# Patient Record
Sex: Male | Born: 1970 | Race: White | Hispanic: No | Marital: Married | Smoking: Never smoker
Health system: Southern US, Community
[De-identification: ages and names within clinical notes are randomized; demographics above are authoritative.]

## PROBLEM LIST (undated history)

## (undated) DIAGNOSIS — K760 Fatty (change of) liver, not elsewhere classified: Secondary | ICD-10-CM

## (undated) DIAGNOSIS — R112 Nausea with vomiting, unspecified: Secondary | ICD-10-CM

## (undated) DIAGNOSIS — Z9889 Other specified postprocedural states: Secondary | ICD-10-CM

## (undated) DIAGNOSIS — F419 Anxiety disorder, unspecified: Secondary | ICD-10-CM

## (undated) DIAGNOSIS — R7989 Other specified abnormal findings of blood chemistry: Secondary | ICD-10-CM

## (undated) DIAGNOSIS — M109 Gout, unspecified: Secondary | ICD-10-CM

## (undated) DIAGNOSIS — E782 Mixed hyperlipidemia: Secondary | ICD-10-CM

## (undated) DIAGNOSIS — N200 Calculus of kidney: Secondary | ICD-10-CM

## (undated) DIAGNOSIS — K5792 Diverticulitis of intestine, part unspecified, without perforation or abscess without bleeding: Secondary | ICD-10-CM

## (undated) DIAGNOSIS — M2012 Hallux valgus (acquired), left foot: Secondary | ICD-10-CM

## (undated) DIAGNOSIS — I10 Essential (primary) hypertension: Secondary | ICD-10-CM

## (undated) DIAGNOSIS — T8859XA Other complications of anesthesia, initial encounter: Secondary | ICD-10-CM

## (undated) DIAGNOSIS — G4730 Sleep apnea, unspecified: Secondary | ICD-10-CM

## (undated) DIAGNOSIS — T4145XA Adverse effect of unspecified anesthetic, initial encounter: Secondary | ICD-10-CM

## (undated) HISTORY — DX: Diverticulitis of intestine, part unspecified, without perforation or abscess without bleeding: K57.92

## (undated) HISTORY — DX: Anxiety disorder, unspecified: F41.9

## (undated) HISTORY — DX: Mixed hyperlipidemia: E78.2

## (undated) HISTORY — DX: Other specified abnormal findings of blood chemistry: R79.89

## (undated) HISTORY — DX: Sleep apnea, unspecified: G47.30

## (undated) HISTORY — PX: VASECTOMY: SHX75

## (undated) HISTORY — DX: Essential (primary) hypertension: I10

## (undated) HISTORY — PX: ANKLE FRACTURE SURGERY: SHX122

## (undated) HISTORY — PX: TONSILLECTOMY AND ADENOIDECTOMY: SUR1326

## (undated) HISTORY — PX: TRIGGER FINGER RELEASE: SHX641

---

## 2006-04-05 ENCOUNTER — Emergency Department: Payer: Self-pay | Admitting: Emergency Medicine

## 2006-11-10 ENCOUNTER — Emergency Department: Payer: Self-pay

## 2009-09-23 ENCOUNTER — Ambulatory Visit: Payer: Self-pay | Admitting: Unknown Physician Specialty

## 2009-09-26 ENCOUNTER — Ambulatory Visit: Payer: Self-pay | Admitting: Unknown Physician Specialty

## 2015-08-04 ENCOUNTER — Encounter: Payer: Self-pay | Admitting: Internal Medicine

## 2015-08-04 ENCOUNTER — Inpatient Hospital Stay: Payer: 59 | Attending: Internal Medicine | Admitting: Internal Medicine

## 2015-08-04 DIAGNOSIS — F419 Anxiety disorder, unspecified: Secondary | ICD-10-CM | POA: Diagnosis not present

## 2015-08-04 DIAGNOSIS — E291 Testicular hypofunction: Secondary | ICD-10-CM

## 2015-08-04 DIAGNOSIS — R7989 Other specified abnormal findings of blood chemistry: Secondary | ICD-10-CM

## 2015-08-04 DIAGNOSIS — G4730 Sleep apnea, unspecified: Secondary | ICD-10-CM

## 2015-08-04 DIAGNOSIS — R5383 Other fatigue: Secondary | ICD-10-CM | POA: Diagnosis not present

## 2015-08-04 DIAGNOSIS — I10 Essential (primary) hypertension: Secondary | ICD-10-CM | POA: Insufficient documentation

## 2015-08-04 DIAGNOSIS — D751 Secondary polycythemia: Secondary | ICD-10-CM | POA: Insufficient documentation

## 2015-08-04 DIAGNOSIS — E785 Hyperlipidemia, unspecified: Secondary | ICD-10-CM | POA: Insufficient documentation

## 2015-08-04 DIAGNOSIS — Z79899 Other long term (current) drug therapy: Secondary | ICD-10-CM

## 2015-08-04 HISTORY — DX: Other specified abnormal findings of blood chemistry: R79.89

## 2015-08-04 LAB — C-REACTIVE PROTEIN: CRP: 0.8 mg/dL (ref <1.0)

## 2015-08-04 LAB — CBC WITH DIFFERENTIAL/PLATELET
Basophils Absolute: 0.1 10*3/uL (ref 0–0.1)
Basophils Relative: 1 %
Eosinophils Absolute: 0.3 10*3/uL (ref 0–0.7)
Eosinophils Relative: 4 %
HEMATOCRIT: 46.1 % (ref 40.0–52.0)
HEMOGLOBIN: 16 g/dL (ref 13.0–18.0)
LYMPHS PCT: 30 %
Lymphs Abs: 2 10*3/uL (ref 1.0–3.6)
MCH: 30.2 pg (ref 26.0–34.0)
MCHC: 34.6 g/dL (ref 32.0–36.0)
MCV: 87.2 fL (ref 80.0–100.0)
MONOS PCT: 6 %
Monocytes Absolute: 0.4 10*3/uL (ref 0.2–1.0)
Neutro Abs: 3.9 10*3/uL (ref 1.4–6.5)
Neutrophils Relative %: 59 %
Platelets: 162 10*3/uL (ref 150–440)
RBC: 5.29 MIL/uL (ref 4.40–5.90)
RDW: 13.3 % (ref 11.5–14.5)
WBC: 6.7 10*3/uL (ref 3.8–10.6)

## 2015-08-04 LAB — COMPREHENSIVE METABOLIC PANEL
ALBUMIN: 4.6 g/dL (ref 3.5–5.0)
ALK PHOS: 76 U/L (ref 38–126)
ALT: 98 U/L — ABNORMAL HIGH (ref 17–63)
ANION GAP: 6 (ref 5–15)
AST: 68 U/L — ABNORMAL HIGH (ref 15–41)
BILIRUBIN TOTAL: 0.6 mg/dL (ref 0.3–1.2)
BUN: 19 mg/dL (ref 6–20)
CALCIUM: 9.6 mg/dL (ref 8.9–10.3)
CO2: 27 mmol/L (ref 22–32)
CREATININE: 1.23 mg/dL (ref 0.61–1.24)
Chloride: 103 mmol/L (ref 101–111)
GFR calc non Af Amer: >60 mL/min (ref >60)
GLUCOSE: 105 mg/dL — AB (ref 65–99)
Potassium: 4.4 mmol/L (ref 3.5–5.1)
Sodium: 136 mmol/L (ref 135–145)
TOTAL PROTEIN: 7.2 g/dL (ref 6.5–8.1)

## 2015-08-04 LAB — TRANSFERRIN: TRANSFERRIN: 302 mg/dL (ref 180–329)

## 2015-08-04 LAB — SEDIMENTATION RATE: Sed Rate: 1 mm/hr (ref 0–15)

## 2015-08-04 NOTE — Progress Notes (Signed)
[place] @ [facility] Telephone:(336) [phone]  Fax:(336) [place]: 1971/04/12  MR#: [id number]  FXJ#:[id number]  No care team member to display  CHIEF COMPLAINT:  Chief Complaint  Patient presents with  . polycythemia     No history exists.    No flowsheet data found.  HISTORY OF PRESENT ILLNESS:   Mr. Baratta is a 44 year old gentleman who is referred to our clinic for evaluation of elevated ferritin. He was found to have minimal erythrocytosis with hemoglobin of 18 g/dL few months ago, while receiving treatment with testosterone gel. Since then treatment with testosterone was discontinued in September 2016, and most recent CBC showed normal hemoglobin concentration. In the same time, ferritin levels were found to be elevated on at least 2 occasions, most recently, less than 10 days ago. Mr. Lona claims that he has obstructive sleep apnea, but he uses CPAP regularly, with good relief of his symptoms, receiving good night sleep. He, however noticed that after discontinuation of testosterone, he has been feeling more fatigued, less energetic, and overall sluggish. He is also worried about his future, while going through the process of divorce.  REVIEW OF SYSTEMS:   Review of Systems  All other systems reviewed and are negative.    PAST MEDICAL HISTORY: Past Medical History  Diagnosis Date  . Hypertension   . Anxiety   . Sleep apnea   . Hyperlipemia, mixed     PAST SURGICAL HISTORY: Past Surgical History  Procedure Laterality Date  . Trigger finger release    . Ankle fracture surgery Left   . Tonsillectomy and adenoidectomy    R index finger phalanx amputation  FAMILY HISTORY Family History  Problem Relation Age of Onset  . Hypertension Father   . Stroke Father   . Stroke Paternal Aunt   Grandfather had liver cirrhosis in his 72s- likely from ETOH  2 daughters are healthy Brother was born with 1 kidney and   ADVANCED DIRECTIVES:  No flowsheet  data found.  HEALTH MAINTENANCE: Social History  Substance Use Topics  . Smoking status: Never Smoker   . Smokeless tobacco: Never Used  . Alcohol Use: Yes     Comment: 3 x week beer and liquor     No Known Allergies  Current Outpatient Prescriptions  Medication Sig Dispense Refill  . ALPRAZolam (XANAX) 0.5 MG tablet Take 0.5 mg by mouth at bedtime as needed for anxiety.    Marland Kitchen amLODipine (NORVASC) 5 MG tablet Take 5 mg by mouth daily.    . colchicine 0.6 MG tablet Take 0.6 mg by mouth daily as needed.    . cyclobenzaprine (FLEXERIL) 5 MG tablet Take 5 mg by mouth 2 (two) times daily as needed for muscle spasms.    . Fenofibrate 150 MG CAPS Take 1 capsule by mouth daily.     No current facility-administered medications for this visit.    OBJECTIVE:  There were no vitals filed for this visit.   There is no height or weight on file to calculate BMI.    ECOG FS:0 - Asymptomatic  Physical Exam  Constitutional: He is oriented to person, place, and time and well-developed, well-nourished, and in no distress. No distress.  Occasion male in no acute distress, ruddy appearance  HENT:  Head: Normocephalic and atraumatic.  Right Ear: External ear normal.  Left Ear: External ear normal.  Nose: Nose normal.  Mouth/Throat: Oropharynx is clear and moist. No oropharyngeal exudate.  Eyes: Conjunctivae and EOM  are normal. Pupils are equal, round, and reactive to light. Right eye exhibits no discharge. Left eye exhibits no discharge. No scleral icterus.  Neck: Normal range of motion. Neck supple. No JVD present. No tracheal deviation present. No thyromegaly present.  Cardiovascular: Normal rate, regular rhythm, normal heart sounds and intact distal pulses.  Exam reveals no gallop and no friction rub.   No murmur heard. Pulmonary/Chest: Effort normal and breath sounds normal. No stridor. No respiratory distress. He has no wheezes. He has no rales. He exhibits no tenderness.  Abdominal: Soft.  Bowel sounds are normal. He exhibits no distension and no mass (Liver tip is palpable). There is no tenderness. There is no rebound and no guarding.  Genitourinary:  Patient deferred  Musculoskeletal: Normal range of motion. He exhibits no edema or tenderness.  Lymphadenopathy:    He has no cervical adenopathy.  Neurological: He is alert and oriented to person, place, and time. He has normal reflexes. No cranial nerve deficit. He exhibits normal muscle tone. Gait normal. Coordination normal. GCS score is 15.  Skin: Skin is warm and dry. No rash noted. He is not diaphoretic. No erythema. No pallor.  Psychiatric: Memory, affect and judgment normal.  Anxious appearing  Nursing note and vitals reviewed.    LAB RESULTS:  CBC Latest Ref Rng 08/04/2015  WBC 3.8 - 10.6 K/uL 6.7  Hemoglobin 13.0 - 18.0 g/dL 16.0  Hematocrit 40.0 - 52.0 % 46.1  Platelets 150 - 440 K/uL 162    Office Visit on 08/04/2015  Component Date Value Ref Range Status  . WBC 08/04/2015 6.7  3.8 - 10.6 K/uL Final  . RBC 08/04/2015 5.29  4.40 - 5.90 MIL/uL Final  . Hemoglobin 08/04/2015 16.0  13.0 - 18.0 g/dL Final  . HCT 08/04/2015 46.1  40.0 - 52.0 % Final  . MCV 08/04/2015 87.2  80.0 - 100.0 fL Final  . MCH 08/04/2015 30.2  26.0 - 34.0 pg Final  . MCHC 08/04/2015 34.6  32.0 - 36.0 g/dL Final  . RDW 08/04/2015 13.3  11.5 - 14.5 % Final  . Platelets 08/04/2015 162  150 - 440 K/uL Final  . Neutrophils Relative % 08/04/2015 59   Final  . Neutro Abs 08/04/2015 3.9  1.4 - 6.5 K/uL Final  . Lymphocytes Relative 08/04/2015 30   Final  . Lymphs Abs 08/04/2015 2.0  1.0 - 3.6 K/uL Final  . Monocytes Relative 08/04/2015 6   Final  . Monocytes Absolute 08/04/2015 0.4  0.2 - 1.0 K/uL Final  . Eosinophils Relative 08/04/2015 4   Final  . Eosinophils Absolute 08/04/2015 0.3  0 - 0.7 K/uL Final  . Basophils Relative 08/04/2015 1   Final  . Basophils Absolute 08/04/2015 0.1  0 - 0.1 K/uL Final  . Sodium 08/04/2015 136  135 -  145 mmol/L Final  . Potassium 08/04/2015 4.4  3.5 - 5.1 mmol/L Final  . Chloride 08/04/2015 103  101 - 111 mmol/L Final  . CO2 08/04/2015 27  22 - 32 mmol/L Final  . Glucose, Bld 08/04/2015 105* 65 - 99 mg/dL Final  . BUN 08/04/2015 19  6 - 20 mg/dL Final  . Creatinine, Ser 08/04/2015 1.23  0.61 - 1.24 mg/dL Final  . Calcium 08/04/2015 9.6  8.9 - 10.3 mg/dL Final  . Total Protein 08/04/2015 7.2  6.5 - 8.1 g/dL Final  . Albumin 08/04/2015 4.6  3.5 - 5.0 g/dL Final  . AST 08/04/2015 68* 15 - 41 U/L Final  . ALT  08/04/2015 98* 17 - 63 U/L Final  . Alkaline Phosphatase 08/04/2015 76  38 - 126 U/L Final  . Total Bilirubin 08/04/2015 0.6  0.3 - 1.2 mg/dL Final  . GFR calc non Af Amer 08/04/2015 >60  >60 mL/min Final  . GFR calc Af Amer 08/04/2015 >60  >60 mL/min Final   Comment: (NOTE) The eGFR has been calculated using the CKD EPI equation. This calculation has not been validated in all clinical situations. eGFR's persistently <60 mL/min signify possible Chronic Kidney Disease.   . Anion gap 08/04/2015 6  5 - 15 Final  . Sed Rate 08/04/2015 1  0 - 15 mm/hr Final    Ferritin from 07/27/2015 measured at 428 ng/mL, above upper limits of normal   STUDIES: No results found.  ASSESSMENT:   MEDICAL DECISION MAKING:  Elevated ferritin-the exact cause is unclear. The patient does not have a family history of liver cirrhosis at the young age, liver cancer, or early heart failure, so whether this is an sign of hemochromatosis or unrelated finding remains to be seen. We will check patient for genetic markers of hereditary hemachromatosis, and if positive, will discuss the need for phlebotomy to prevent iron overload in the future. However, ferritin can be elevated in the multitude of other conditions, such as inflammatory conditions, EtOH intake, hepatitis. We will decide on whether he will need additional evaluation after genetic testing is completed.  Erythrocytosis-most likely secondary to  the use of testosterone gel. Resolved after discontinuation of the use of testosterone. Obstructive sleep apnea may be contributing to erythrocytosis as well. No need for phlebotomy at this point. Continue to use CPAP every night.  Hypogonadism-we discussed the fact that although there is an FDA warning regarding the use of testosterone, and its connection to an increased risk of strokes and MRIs, it applies to the patients for 80 years of age and older. From the description it appears that Mr. Schnick derived significant benefit from the use of testosterone, as such if needed, testosterone should be resumed to improve his functional status. Importantly, no need for phlebotomy if hemoglobin starts to rise again.  Patient expressed understanding and was in agreement with this plan. He also understands that He can call clinic at any time with any questions, concerns, or complaints.  He'll return to our clinic in 2 weeks  No matching staging information was found for the patient.  Roxana Hires, MD   08/04/2015 11:12 AM

## 2015-08-08 LAB — HEMOCHROMATOSIS DNA-PCR(C282Y,H63D)

## 2015-08-18 ENCOUNTER — Ambulatory Visit: Payer: 59

## 2015-08-25 ENCOUNTER — Encounter: Payer: Self-pay | Admitting: Internal Medicine

## 2015-08-25 ENCOUNTER — Inpatient Hospital Stay (HOSPITAL_BASED_OUTPATIENT_CLINIC_OR_DEPARTMENT_OTHER): Payer: 59 | Admitting: Internal Medicine

## 2015-08-25 VITALS — BP 158/79 | HR 81 | Temp 98.0°F | Resp 18 | Ht 72.0 in | Wt 230.8 lb

## 2015-08-25 DIAGNOSIS — I10 Essential (primary) hypertension: Secondary | ICD-10-CM

## 2015-08-25 DIAGNOSIS — Z86711 Personal history of pulmonary embolism: Secondary | ICD-10-CM

## 2015-08-25 DIAGNOSIS — D751 Secondary polycythemia: Secondary | ICD-10-CM | POA: Diagnosis not present

## 2015-08-25 DIAGNOSIS — E785 Hyperlipidemia, unspecified: Secondary | ICD-10-CM

## 2015-08-25 DIAGNOSIS — F419 Anxiety disorder, unspecified: Secondary | ICD-10-CM

## 2015-08-25 DIAGNOSIS — Z9081 Acquired absence of spleen: Secondary | ICD-10-CM

## 2015-08-25 DIAGNOSIS — Z7901 Long term (current) use of anticoagulants: Secondary | ICD-10-CM

## 2015-08-25 DIAGNOSIS — R7989 Other specified abnormal findings of blood chemistry: Secondary | ICD-10-CM

## 2015-08-25 DIAGNOSIS — Z79899 Other long term (current) drug therapy: Secondary | ICD-10-CM | POA: Diagnosis not present

## 2015-08-25 NOTE — Progress Notes (Signed)
[place] @ [facility] Telephone:(336) [phone]  Fax:(336) [place]: 09-27-1970  MR#: [id number]  ZWC#:[id number]  No care team member to display  CHIEF COMPLAINT:  Chief Complaint  Patient presents with  . erythrocytosis     No history exists.    No flowsheet data found.  HISTORY OF PRESENT ILLNESS:   Cory Johnston is a 44 year old gentleman who is referred to our clinic for evaluation of elevated ferritin. He was found to have minimal erythrocytosis with hemoglobin of 18 g/dL few months ago, while receiving treatment with testosterone gel. Since then treatment with testosterone was discontinued in September 2016, and most recent CBC showed normal hemoglobin concentration. In the same time, ferritin levels were found to be elevated on at least 2 occasions, most recently, less than 10 days ago. Cory Johnston claims that he has obstructive sleep apnea, but he uses CPAP regularly, with good relief of his symptoms, receiving good night sleep. He, however noticed that after discontinuation of testosterone, he has been feeling more fatigued, less energetic, and overall sluggish. He is also worried about his future, while going through the process of divorce.  Current status: Cory Johnston returns to our clinic for a follow-up visit. He has not noticed any changes in his overall condition area as he continues to drink alcohol several times a week on a regular basis, and plans on restarting AndroGel.  REVIEW OF SYSTEMS:   Review of Systems  All other systems reviewed and are negative.    PAST MEDICAL HISTORY: Past Medical History  Diagnosis Date  . Hypertension   . Anxiety   . Sleep apnea   . Hyperlipemia, mixed   . Elevated ferritin 08/04/2015    PAST SURGICAL HISTORY: Past Surgical History  Procedure Laterality Date  . Trigger finger release    . Ankle fracture surgery Left   . Tonsillectomy and adenoidectomy    R index finger phalanx amputation  FAMILY  HISTORY Family History  Problem Relation Age of Onset  . Hypertension Father   . Stroke Father   . Stroke Paternal Aunt   Grandfather had liver cirrhosis in his 34s- likely from ETOH  2 daughters are healthy Brother was born with 1 kidney  ADVANCED DIRECTIVES:  No flowsheet data found.  HEALTH MAINTENANCE: Social History  Substance Use Topics  . Smoking status: Never Smoker   . Smokeless tobacco: Never Used  . Alcohol Use: Yes     Comment: 3 x week beer and liquor     No Known Allergies  Current Outpatient Prescriptions  Medication Sig Dispense Refill  . ALPRAZolam (XANAX) 0.5 MG tablet Take 0.5 mg by mouth at bedtime as needed for anxiety.    Marland Kitchen amLODipine (NORVASC) 5 MG tablet Take 5 mg by mouth daily.    . colchicine 0.6 MG tablet Take 0.6 mg by mouth daily as needed.    . cyclobenzaprine (FLEXERIL) 5 MG tablet Take 5 mg by mouth 2 (two) times daily as needed for muscle spasms.    . Fenofibrate 150 MG CAPS Take 1 capsule by mouth daily.     No current facility-administered medications for this visit.    OBJECTIVE:  Filed Vitals:   08/25/15 1017  BP: 158/79  Pulse: 81  Temp: 98 F (36.7 C)  Resp: 18     Body mass index is 31.3 kg/(m^2).    ECOG FS:0 - Asymptomatic  Physical Exam  Constitutional: He is oriented to person, place, and time  and well-developed, well-nourished, and in no distress. No distress.  Occasion male in no acute distress, ruddy appearance  HENT:  Head: Normocephalic and atraumatic.  Right Ear: External ear normal.  Left Ear: External ear normal.  Nose: Nose normal.  Mouth/Throat: Oropharynx is clear and moist. No oropharyngeal exudate.  Eyes: Conjunctivae and EOM are normal. Pupils are equal, round, and reactive to light. Right eye exhibits no discharge. Left eye exhibits no discharge. No scleral icterus.  Neck: Normal range of motion. Neck supple. No JVD present. No tracheal deviation present. No thyromegaly present.  Cardiovascular: Normal  rate, regular rhythm, normal heart sounds and intact distal pulses.  Exam reveals no gallop and no friction rub.   No murmur heard. Pulmonary/Chest: Effort normal and breath sounds normal. No stridor. No respiratory distress. He has no wheezes. He has no rales. He exhibits no tenderness.  Abdominal: Soft. Bowel sounds are normal. He exhibits no distension and no mass (Liver tip is palpable). There is no tenderness. There is no rebound and no guarding.  Genitourinary:  Patient deferred  Musculoskeletal: Normal range of motion. He exhibits no edema or tenderness.  Lymphadenopathy:    He has no cervical adenopathy.  Neurological: He is alert and oriented to person, place, and time. He has normal reflexes. No cranial nerve deficit. He exhibits normal muscle tone. Gait normal. Coordination normal. GCS score is 15.  Skin: Skin is warm and dry. No rash noted. He is not diaphoretic. No erythema. No pallor.  Psychiatric: Memory, affect and judgment normal.  Anxious appearing  Nursing note and vitals reviewed.    LAB RESULTS:  CBC Latest Ref Rng 08/04/2015  WBC 3.8 - 10.6 K/uL 6.7  Hemoglobin 13.0 - 18.0 g/dL 16.0  Hematocrit 40.0 - 52.0 % 46.1  Platelets 150 - 440 K/uL 162    No visits with results within 5 Day(s) from this visit. Latest known visit with results is:  Office Visit on 08/04/2015  Component Date Value Ref Range Status  . DNA Mutation Analysis 08/04/2015 Comment   Final   Comment: (NOTE) NO MUTATION IDENTIFIED Interpretation: This patient's sample was analyzed for the hereditary hemochromatosis (HH) mutations C282Y, H63D, S65C. No mutation was identified. The mutations analyzed by [facility] are most common in the Caucasian population, and up to 90% of affected Caucasians will have a positive test result. Because this panel does not identify rare HH mutations or HH mutations found in other ethnic groups, there are a small number of people who may have a negative test but  may actually be affected. The diagnosis of HH should include clinical findings and other test results such as transferrin-iron saturation and/or serum ferritin studies and/or liver biopsy.  If this patient has a history of HH, in many cases a specific carrier risk can be determined based on this negative result. Methodology: DNA Analysis of the HFE gene was performed by PCR amplification followed by restriction enzyme digestion analyses. Reference: Cathe Mons and Walker AP. (20                          00). Genet Test 4:97-101. Cogswell ME et al. (1999). AM J Prev Med 16:134-140. Bomford A (2002). Lancet 360(9346):1673-81. Stan Head et al. (2002). Blood Cells, Molecules. and Diseases.  29(3):418-432. Imperatore G et al. (2003). Genet Med. 5(1):1-8. Cogswell ME et al. (2003). Genet Med. 5(4):304-10. Genetic counselors are available for health care providers to discuss results at [phone]. Allison Quarry, PhD, [facility]  Ruben Reason, PhD, [facility] Jens Som, PhD, [facility] Annetta Maw, M.S., PhD, [facility] Alfredo Bach, PhD, [facility] Norva Riffle, PhD, [facility] Earlean Polka, PhD, [facility] Performed At: [facility] [address] [place], [place] [id number] Nechama Guard MD BO:[id number]   . WBC 08/04/2015 6.7  3.8 - 10.6 K/uL Final  . RBC 08/04/2015 5.29  4.40 - 5.90 MIL/uL Final  . Hemoglobin 08/04/2015 16.0  13.0 - 18.0 g/dL Final  . HCT 08/04/2015 46.1  40.0 - 52.0 % Final  . MCV 08/04/2015 87.2  80.0 - 100.0 fL Final  . MCH 08/04/2015 30.2  26.0 - 34.0 pg Final  . MCHC 08/04/2015 34.6  32.0 - 36.0 g/dL Final  . RDW 08/04/2015 13.3  11.5 - 14.5 % Final  . Platelets 08/04/2015 162  150 - 440 K/uL Final  . Neutrophils Relative % 08/04/2015 59   Final  . Neutro Abs 08/04/2015 3.9  1.4 - 6.5 K/uL Final  . Lymphocytes Relative 08/04/2015 30   Final  . Lymphs Abs 08/04/2015 2.0  1.0 - 3.6 K/uL Final  . Monocytes Relative 08/04/2015 6   Final  . Monocytes  Absolute 08/04/2015 0.4  0.2 - 1.0 K/uL Final  . Eosinophils Relative 08/04/2015 4   Final  . Eosinophils Absolute 08/04/2015 0.3  0 - 0.7 K/uL Final  . Basophils Relative 08/04/2015 1   Final  . Basophils Absolute 08/04/2015 0.1  0 - 0.1 K/uL Final  . Sodium 08/04/2015 136  135 - 145 mmol/L Final  . Potassium 08/04/2015 4.4  3.5 - 5.1 mmol/L Final  . Chloride 08/04/2015 103  101 - 111 mmol/L Final  . CO2 08/04/2015 27  22 - 32 mmol/L Final  . Glucose, Bld 08/04/2015 105* 65 - 99 mg/dL Final  . BUN 08/04/2015 19  6 - 20 mg/dL Final  . Creatinine, Ser 08/04/2015 1.23  0.61 - 1.24 mg/dL Final  . Calcium 08/04/2015 9.6  8.9 - 10.3 mg/dL Final  . Total Protein 08/04/2015 7.2  6.5 - 8.1 g/dL Final  . Albumin 08/04/2015 4.6  3.5 - 5.0 g/dL Final  . AST 08/04/2015 68* 15 - 41 U/L Final  . ALT 08/04/2015 98* 17 - 63 U/L Final  . Alkaline Phosphatase 08/04/2015 76  38 - 126 U/L Final  . Total Bilirubin 08/04/2015 0.6  0.3 - 1.2 mg/dL Final  . GFR calc non Af Amer 08/04/2015 >60  >60 mL/min Final  . GFR calc Af Amer 08/04/2015 >60  >60 mL/min Final   Comment: (NOTE) The eGFR has been calculated using the CKD EPI equation. This calculation has not been validated in all clinical situations. eGFR's persistently <60 mL/min signify possible Chronic Kidney Disease.   . Anion gap 08/04/2015 6  5 - 15 Final  . CRP 08/04/2015 0.8  <1.0 mg/dL Final   Performed at [facility]  . Sed Rate 08/04/2015 1  0 - 15 mm/hr Final  . Transferrin 08/04/2015 302  180 - 329 mg/dL Final   Performed at [facility]    Ferritin from 07/27/2015 measured at 428 ng/mL, above upper limits of normal   STUDIES: No results found.  ASSESSMENT:   MEDICAL DECISION MAKING:  Elevated ferritin-the exact cause is unclear. He certainly does not have hereditary hemochromatosis, since genetic testing returned negative. As we discussed before, there are other causes of elevated ferritin. He does not have any  evidence of an ongoing inflammation and his sedimentation rate is normal. There  is a chance that the liver damage from alcohol intake and fenofibrate could contribute to elevation and ferritin. We discussed the possibility of stopping fenofibrate and alcohol for 2 weeks prior to next appointment, and rechecking liver function tests and ferritin along with hepatitis profile prior to next appointment. Patient seems to be agreeable to this plan.  Erythrocytosis-most likely secondary to the use of testosterone gel. Resolved after discontinuation of the use of testosterone. Obstructive sleep apnea may be contributing to erythrocytosis as well. No need for phlebotomy at this point. Continue to use CPAP every night. Once again I confirmed with Cory Johnston that he likely will have worsening of erythrocytosis once he restarts AndroGel, and that it is not an indication for phlebotomy.   Patient expressed understanding and was in agreement with this plan. He also understands that He can call clinic at any time with any questions, concerns, or complaints.  He'll return to our clinic in 6 months , and if at that point no significant abnormalities are seen, he will be discharged from the clinic.  No matching staging information was found for the patient.  Roxana Hires, MD   08/25/2015 10:19 AM

## 2016-01-16 ENCOUNTER — Ambulatory Visit: Payer: Self-pay | Admitting: Podiatry

## 2016-01-27 NOTE — Discharge Instructions (Signed)
[facility] °[facility] ° °POST OPERATIVE INSTRUCTIONS FOR DR. TROXLER AND DR. FOWLER °[facility] ° ° °1. Take your medication as prescribed.  Pain medication should be taken only as needed. ° °2. Keep the dressing clean, dry and intact. ° °3. Keep your foot elevated above the heart level for the first 48 hours. ° °4. Walking to the bathroom and brief periods of walking are acceptable, unless we have instructed you to be non-weight bearing. ° °5. Always wear your post-op shoe when walking.  Always use your crutches if you are to be non-weight bearing. ° °6. Do not take a shower. Baths are permissible as long as the foot is kept out of the water.  ° °7. Every hour you are awake:  °- Bend your knee 15 times. °- Flex foot 15 times °- Massage calf 15 times ° °8. Call [facility] ([phone]) if any of the following problems occur: °- You develop a temperature or fever. °- The bandage becomes saturated with blood. °- Medication does not stop your pain. °- Injury of the foot occurs. °- Any symptoms of infection including redness, odor, or red streaks running from wound. ° °General Anesthesia, Adult, Care After °Refer to this sheet in the next few weeks. These instructions provide you with information on caring for yourself after your procedure. Your health care provider may also give you more specific instructions. Your treatment has been planned according to current medical practices, but problems sometimes occur. Call your health care provider if you have any problems or questions after your procedure. °WHAT TO EXPECT AFTER THE PROCEDURE °After the procedure, it is typical to experience: °· Sleepiness. °· Nausea and vomiting. °HOME CARE INSTRUCTIONS °· For the first 24 hours after general anesthesia: °¨ Have a responsible person with you. °¨ Do not drive a car. If you are alone, do not take public transportation. °¨ Do not drink alcohol. °¨ Do not take  medicine that has not been prescribed by your health care provider. °¨ Do not sign important papers or make important decisions. °¨ You may resume a normal diet and activities as directed by your health care provider. °· Change bandages (dressings) as directed. °· If you have questions or problems that seem related to general anesthesia, call the hospital and ask for the anesthetist or anesthesiologist on call. °SEEK MEDICAL CARE IF: °· You have nausea and vomiting that continue the day after anesthesia. °· You develop a rash. °SEEK IMMEDIATE MEDICAL CARE IF:  °· You have difficulty breathing. °· You have chest pain. °· You have any allergic problems. °  °This information is not intended to replace advice given to you by your health care provider. Make sure you discuss any questions you have with your health care provider. °  °Document Released: 11/19/2000 Document Revised: 09/03/2014 Document Reviewed: 12/12/2011 °Elsevier Interactive Patient Education ©2016 [employer] Inc. ° °

## 2016-02-01 ENCOUNTER — Ambulatory Visit
Admission: RE | Admit: 2016-02-01 | Discharge: 2016-02-01 | Disposition: A | Discharge status: Home / Self Care | Payer: 59 | Source: Ambulatory Visit | Attending: Podiatry | Admitting: Podiatry

## 2016-02-01 ENCOUNTER — Encounter
Admission: RE | Disposition: A | Discharge status: Home / Self Care | Payer: Self-pay | Source: Ambulatory Visit | Attending: Podiatry

## 2016-02-01 ENCOUNTER — Encounter: Payer: Self-pay | Admitting: *Deleted

## 2016-02-01 ENCOUNTER — Ambulatory Visit: Payer: 59 | Admitting: Anesthesiology

## 2016-02-01 DIAGNOSIS — M2012 Hallux valgus (acquired), left foot: Secondary | ICD-10-CM | POA: Diagnosis present

## 2016-02-01 DIAGNOSIS — I10 Essential (primary) hypertension: Secondary | ICD-10-CM | POA: Insufficient documentation

## 2016-02-01 DIAGNOSIS — G4730 Sleep apnea, unspecified: Secondary | ICD-10-CM | POA: Diagnosis not present

## 2016-02-01 HISTORY — DX: Other specified postprocedural states: Z98.890

## 2016-02-01 HISTORY — DX: Gout, unspecified: M10.9

## 2016-02-01 HISTORY — DX: Adverse effect of unspecified anesthetic, initial encounter: T41.45XA

## 2016-02-01 HISTORY — PX: AIKEN OSTEOTOMY: SHX6331

## 2016-02-01 HISTORY — DX: Hallux valgus (acquired), left foot: M20.12

## 2016-02-01 HISTORY — DX: Other complications of anesthesia, initial encounter: T88.59XA

## 2016-02-01 HISTORY — DX: Other specified postprocedural states: R11.2

## 2016-02-01 SURGERY — BUNIONECTOMY, AKIN
Anesthesia: General | Laterality: Left | Wound class: Clean

## 2016-02-01 MED ORDER — MIDAZOLAM HCL 2 MG/2ML IJ SOLN: 1.0000 mg | INTRAMUSCULAR | Status: DC | PRN

## 2016-02-01 MED ORDER — OXYCODONE HCL 5 MG/5ML PO SOLN: 5.0000 mg | Freq: Once | ORAL | Status: AC | PRN

## 2016-02-01 MED ORDER — ONDANSETRON HCL 4 MG PO TABS: 4.0000 mg | ORAL_TABLET | Freq: Four times a day (QID) | ORAL | Status: DC | PRN

## 2016-02-01 MED ORDER — ROPIVACAINE HCL 5 MG/ML IJ SOLN
INTRAMUSCULAR | Status: DC | PRN
  Administered 2016-02-01: 20 mL via PERINEURAL

## 2016-02-01 MED ORDER — BUPIVACAINE HCL (PF) 0.25 % IJ SOLN
INTRAMUSCULAR | Status: DC | PRN
  Administered 2016-02-01: 10 mL

## 2016-02-01 MED ORDER — PROMETHAZINE HCL 25 MG/ML IJ SOLN: 6.2500 mg | INTRAMUSCULAR | Status: DC | PRN

## 2016-02-01 MED ORDER — MIDAZOLAM HCL 5 MG/ML IJ SOLN
INTRAMUSCULAR | Status: DC | PRN
  Administered 2016-02-01: 2 mg via INTRAVENOUS

## 2016-02-01 MED ORDER — DEXAMETHASONE SODIUM PHOSPHATE 4 MG/ML IJ SOLN
INTRAMUSCULAR | Status: DC | PRN
  Administered 2016-02-01: 4 mg via INTRAVENOUS

## 2016-02-01 MED ORDER — CEFAZOLIN SODIUM-DEXTROSE 2-4 GM/100ML-% IV SOLN
2.0000 g | Freq: Once | INTRAVENOUS | Status: AC
  Administered 2016-02-01: 2 g via INTRAVENOUS

## 2016-02-01 MED ORDER — PROPOFOL 10 MG/ML IV BOLUS
INTRAVENOUS | Status: DC | PRN
  Administered 2016-02-01: 200 mg via INTRAVENOUS

## 2016-02-01 MED ORDER — LACTATED RINGERS IV SOLN
INTRAVENOUS | Status: DC
  Administered 2016-02-01: 11:00:00 via INTRAVENOUS

## 2016-02-01 MED ORDER — FENTANYL CITRATE (PF) 100 MCG/2ML IJ SOLN
INTRAMUSCULAR | Status: DC | PRN
  Administered 2016-02-01: 100 ug via INTRAVENOUS

## 2016-02-01 MED ORDER — LACTATED RINGERS IV SOLN: INTRAVENOUS | Status: DC

## 2016-02-01 MED ORDER — MEPERIDINE HCL 25 MG/ML IJ SOLN: 6.2500 mg | INTRAMUSCULAR | Status: DC | PRN

## 2016-02-01 MED ORDER — OXYCODONE-ACETAMINOPHEN 5-325 MG PO TABS: 1.0000 | ORAL_TABLET | ORAL | Status: DC | PRN

## 2016-02-01 MED ORDER — ONDANSETRON HCL 4 MG/2ML IJ SOLN
INTRAMUSCULAR | Status: DC | PRN
  Administered 2016-02-01: 4 mg via INTRAVENOUS

## 2016-02-01 MED ORDER — FENTANYL CITRATE (PF) 100 MCG/2ML IJ SOLN: 50.0000 ug | INTRAMUSCULAR | Status: DC | PRN

## 2016-02-01 MED ORDER — ONDANSETRON HCL 4 MG/2ML IJ SOLN: 4.0000 mg | Freq: Four times a day (QID) | INTRAMUSCULAR | Status: DC | PRN

## 2016-02-01 MED ORDER — SCOPOLAMINE 1 MG/3DAYS TD PT72
1.0000 | MEDICATED_PATCH | Freq: Once | TRANSDERMAL | Status: DC
  Administered 2016-02-01: 1.5 mg via TRANSDERMAL

## 2016-02-01 MED ORDER — OXYCODONE-ACETAMINOPHEN 7.5-325 MG PO TABS: ORAL_TABLET | ORAL | Status: DC

## 2016-02-01 MED ORDER — LIDOCAINE HCL (CARDIAC) 20 MG/ML IV SOLN
INTRAVENOUS | Status: DC | PRN
  Administered 2016-02-01: 30 mg via INTRATRACHEAL

## 2016-02-01 MED ORDER — OXYCODONE HCL 5 MG PO TABS
5.0000 mg | ORAL_TABLET | Freq: Once | ORAL | Status: AC | PRN
  Administered 2016-02-01: 5 mg via ORAL

## 2016-02-01 MED ORDER — FENTANYL CITRATE (PF) 100 MCG/2ML IJ SOLN: 25.0000 ug | INTRAMUSCULAR | Status: DC | PRN

## 2016-02-01 SURGICAL SUPPLY — 44 items
BANDAGE ELASTIC 4 VELCRO NS (GAUZE/BANDAGES/DRESSINGS) ×2 IMPLANT
BENZOIN TINCTURE PRP APPL 2/3 (GAUZE/BANDAGES/DRESSINGS) ×2 IMPLANT
BLADE MED AGGRESSIVE (BLADE) ×2 IMPLANT
BLADE OSC/SAGITTAL MD 5.5X18 (BLADE) IMPLANT
BLADE OSC/SAGITTAL MD 9X18.5 (BLADE) ×2 IMPLANT
BLADE SURG 15 STRL LF DISP TIS (BLADE) IMPLANT
BLADE SURG 15 STRL SS (BLADE)
BNDG ESMARK 4X12 TAN STRL LF (GAUZE/BANDAGES/DRESSINGS) ×2 IMPLANT
BNDG GAUZE 4.5X4.1 6PLY STRL (MISCELLANEOUS) ×2 IMPLANT
BNDG STRETCH 4X75 STRL LF (GAUZE/BANDAGES/DRESSINGS) ×2 IMPLANT
CANISTER SUCT 1200ML W/VALVE (MISCELLANEOUS) ×2 IMPLANT
COVER LIGHT HANDLE UNIVERSAL (MISCELLANEOUS) ×4 IMPLANT
CUFF TOURN SGL QUICK 18 (TOURNIQUET CUFF) ×2 IMPLANT
DRAPE FLUOR MINI C-ARM 54X84 (DRAPES) ×2 IMPLANT
DURAPREP 26ML APPLICATOR (WOUND CARE) ×2 IMPLANT
GAUZE PETRO XEROFOAM 1X8 (MISCELLANEOUS) ×2 IMPLANT
GAUZE SPONGE 4X4 12PLY STRL (GAUZE/BANDAGES/DRESSINGS) ×2 IMPLANT
GLOVE BIO SURGEON STRL SZ7.5 (GLOVE) ×2 IMPLANT
GLOVE INDICATOR 8.0 STRL GRN (GLOVE) ×2 IMPLANT
GOWN STRL REUS W/ TWL LRG LVL3 (GOWN DISPOSABLE) ×2 IMPLANT
GOWN STRL REUS W/TWL LRG LVL3 (GOWN DISPOSABLE) ×2
K-WIRE DBL END TROCAR 6X.045 (WIRE) ×2
K-WIRE DBL END TROCAR 6X.062 (WIRE) ×2
K-WIRE DBL TROCAR .062X4 ×2 IMPLANT
KIT ROOM TURNOVER OR (KITS) ×2 IMPLANT
KWIRE DBL END TROCAR 6X.045 (WIRE) ×1 IMPLANT
KWIRE DBL END TROCAR 6X.062 (WIRE) ×1 IMPLANT
KWIRE DBL TROCAR .062X4 ×1 IMPLANT
Metric super staple (Staple) ×2 IMPLANT
NS IRRIG 500ML POUR BTL (IV SOLUTION) ×2 IMPLANT
PACK EXTREMITY ARMC (MISCELLANEOUS) ×2 IMPLANT
PAD GROUND ADULT SPLIT (MISCELLANEOUS) ×2 IMPLANT
PIN BALLS 3/8 F/.045 WIRE (MISCELLANEOUS) IMPLANT
RASP SM TEAR CROSS CUT (RASP) ×2 IMPLANT
STOCKINETTE STRL 6IN 960660 (GAUZE/BANDAGES/DRESSINGS) ×2 IMPLANT
STRAP BODY AND KNEE 60X3 (MISCELLANEOUS) ×2 IMPLANT
STRIP CLOSURE SKIN 1/4X4 (GAUZE/BANDAGES/DRESSINGS) ×2 IMPLANT
SUT MNCRL 5-0+ PC-1 (SUTURE) ×1 IMPLANT
SUT MONOCRYL 5-0 (SUTURE) ×1
SUT VIC AB 2-0 SH 27 (SUTURE)
SUT VIC AB 2-0 SH 27XBRD (SUTURE) IMPLANT
SUT VIC AB 3-0 SH 27 (SUTURE)
SUT VIC AB 3-0 SH 27X BRD (SUTURE) IMPLANT
SUT VIC AB 4-0 FS2 27 (SUTURE) ×2 IMPLANT

## 2016-02-01 NOTE — Progress Notes (Signed)
Assisted Scott Mculloch ANMD with left, ultrasound guided, popliteal block. Side rails up, monitors on throughout procedure. See vital signs in flow sheet. Tolerated Procedure well.   

## 2016-02-01 NOTE — Op Note (Signed)
Operative note   Surgeon:Doralee Kocak    Assistant:none    Preop diagnosis:Left foot hallux valgus    Postop diagnosis:same    Procedure:Double osteotomy (austin/akin) hallux valgus correction    EBL: Minimal     Anesthesia:regional and general    Hemostasis: Ankle tourniquet inflated to 250 mmHg for approximate 70 minutes    Specimen: Bone and soft tissue with crystals    Complications: None    Operative indications:Foye R Koker is an 45 y.o. that presents today for surgical intervention.  The risks/benefits/alternatives/complications have been discussed and consent has been given.    Procedure:  Patient was brought into the OR and placed on the operating table in thesupine position. After anesthesia was obtained theleft lower extremity was prepped and draped in usual sterile fashion.  Attention was directed to the dorsomedial aspect of the left first MTPJ where a dorsal medial incision was performed. Sharp and blunt dissection carried down to the capsule. A T capsulotomy was then performed. The prominent dorsal medial eminence was noted and transected with a power saw. At this time a V osteotomy was created and the capital fragment was translocated laterally. Good realignment of the metatarsal was noted but there was still valgus noted of the great toe at the metatarsophalangeal joint. At this time I elected to perform a webspace dissection. Sharp and blunt dissection was carried down to the deep transverse intermetatarsal ligament. This was transected. Next the conjoined tendon of the abductor was removed from the lateral capsule. Finally the sesamoid apparatus was freed. The first MTPJ was placed range of motion and much more flexible. I was able to realign the metatarsophalangeal joint. The capital fragment was translocated back into the lateral position and stabilized with a 0.062 K wire. The ensuing overhanging ledge was transected with a power saw. The remainder of the prominence  was smoothed with a power rasp. The wound was flushed with copious amounts or irrigation.  There was still noted be valgus of the great toe distal to the metatarsophalangeal joint and a an Akin osteotomy was then performed. Attention was directed to the dorsal aspect of the proximal phalanx where a guidewire was placed to the lateral aspect of the great toe from a dorsal to plantar position just distal to the base of the proximal phalanx. This was used as the apical axis guide for the cut. A small wedge the apex lateral was performed. The wedge was then closed and the lateral hinge was left intact. A 11 x 10 mm compression staple was applied to the dorsal medial proximal phalanx. Good compression and stability was noted. Good alignment was noted in all planes. The wounds were then flushed with copious amounts or irrigation. Layered closure was performed with a 3-0 Vicryl for the deeper layer. A small capsulorrhaphy was performed. 4-0 Vicryl for the subcutaneous tissue and 5-0 Monocryl undyed for the skin. A well compressive sterile dressing was then applied.    Patient tolerated the procedure and anesthesia well.  Was transported from the OR to the PACU with all vital signs stable and vascular status intact. To be discharged per routine protocol.  Will follow up in approximately 1 week in the outpatient clinic.

## 2016-02-01 NOTE — Transfer of Care (Signed)
Immediate Anesthesia Transfer of Care Note  Patient: Cory Johnston  Procedure(s) Performed: Procedure(s) with comments: Barbie Banner OSTEOTOMY AUSTIN LEFT 1ST METATARSAL (Left) - WITH POPLITEAL CPAP  Patient Location: PACU  Anesthesia Type: General  Level of Consciousness: awake, alert  and patient cooperative  Airway and Oxygen Therapy: Patient Spontanous Breathing and Patient connected to supplemental oxygen  Post-op Assessment: Post-op Vital signs reviewed, Patient's Cardiovascular Status Stable, Respiratory Function Stable, Patent Airway and No signs of Nausea or vomiting  Post-op Vital Signs: Reviewed and stable  Complications: No apparent anesthesia complications

## 2016-02-01 NOTE — Anesthesia Postprocedure Evaluation (Signed)
Anesthesia Post Note  Patient: Cory Johnston  Procedure(s) Performed: Procedure(s) (LRB): Barbie Banner OSTEOTOMY AUSTIN LEFT 1ST METATARSAL (Left)  Patient location during evaluation: PACU Anesthesia Type: General Level of consciousness: awake and alert Pain management: pain level controlled Vital Signs Assessment: post-procedure vital signs reviewed and stable Respiratory status: spontaneous breathing, nonlabored ventilation, respiratory function stable and patient connected to nasal cannula oxygen Cardiovascular status: blood pressure returned to baseline and stable Postop Assessment: no signs of nausea or vomiting Anesthetic complications: no    Marshell Levan

## 2016-02-01 NOTE — Anesthesia Procedure Notes (Addendum)
Anesthesia Regional Block:  Popliteal block  Pre-Anesthetic Checklist: ,, timeout performed, Correct Patient, Correct Site, Correct Laterality, Correct Procedure, Correct Position, site marked, Risks and benefits discussed,  Surgical consent,  Pre-op evaluation,  At surgeon's request and post-op pain management  Laterality: Left  Prep: chloraprep       Needles:  Injection technique: Single-shot  Needle Type: Echogenic Needle     Needle Length: 9cm 9 cm Needle Gauge: 21 and 21 G    Additional Needles:  Procedures: ultrasound guided (picture in chart) Popliteal block Narrative:  Start time: 02/01/2016 11:28 AM End time: 02/01/2016 11:34 AM Injection made incrementally with aspirations every 5 mL.  Performed by: Personally  Anesthesiologist: MCCULLOCH, SCOTT  Additional Notes: Functioning IV was confirmed and monitors applied. Ultrasound guidance: relevant anatomy identified, needle position confirmed, local anesthetic spread visualized around nerve(s)., vascular puncture avoided.  Image printed for medical record.  Negative aspiration and no paresthesias; incremental administration of local anesthetic. The patient tolerated the procedure well. Vitals signes recorded in RN notes.   Procedure Name: LMA Insertion Date/Time: 02/01/2016 12:19 PM Performed by: Londell Moh Pre-anesthesia Checklist: Patient identified, Emergency Drugs available, Suction available, Timeout performed and Patient being monitored Patient Re-evaluated:Patient Re-evaluated prior to inductionOxygen Delivery Method: Circle system utilized Preoxygenation: Pre-oxygenation with 100% oxygen Intubation Type: IV induction LMA: LMA inserted LMA Size: 4.0 Number of attempts: 1 Placement Confirmation: positive ETCO2 and breath sounds checked- equal and bilateral Tube secured with: Tape

## 2016-02-01 NOTE — Anesthesia Preprocedure Evaluation (Addendum)
Anesthesia Evaluation  Patient identified by MRN, date of birth, ID band Patient awake    History of Anesthesia Complications (+) PONV  Airway Mallampati: III  TM Distance: >3 FB Neck ROM: Full    Dental   Pulmonary sleep apnea and Continuous Positive Airway Pressure Ventilation ,    breath sounds clear to auscultation       Cardiovascular hypertension, Pt. on medications Normal cardiovascular exam     Neuro/Psych    GI/Hepatic   Endo/Other    Renal/GU      Musculoskeletal   Abdominal   Peds  Hematology   Anesthesia Other Findings   Reproductive/Obstetrics                            Anesthesia Physical Anesthesia Plan  ASA: II  Anesthesia Plan: General   Post-op Pain Management: GA combined w/ Regional for post-op pain   Induction: Intravenous  Airway Management Planned: LMA  Additional Equipment:   Intra-op Plan:   Post-operative Plan:   Informed Consent: I have reviewed the patients History and Physical, chart, labs and discussed the procedure including the risks, benefits and alternatives for the proposed anesthesia with the patient or authorized representative who has indicated his/her understanding and acceptance.     Plan Discussed with: CRNA  Anesthesia Plan Comments:         Anesthesia Quick Evaluation

## 2016-02-01 NOTE — H&P (Signed)
  HISTORY AND PHYSICAL INTERVAL NOTE:  02/01/2016  11:59 AM  Cory Johnston  has presented today for surgery, with the diagnosis of M20.12 ACQUIRED HALLUX VALGUS OF LEFT FOOT.  The various methods of treatment have been discussed with the patient.  No guarantees were given.  After consideration of risks, benefits and other options for treatment, the patient has consented to surgery.  I have reviewed the patients' chart and labs.    Patient Vitals for the past 24 hrs:  BP Temp Pulse Resp SpO2 Height Weight  02/01/16 1135 (!) 137/93 mmHg - (!) 59 (!) 9 93 % - -  02/01/16 1130 (!) 150/99 mmHg - 68 11 95 % - -  02/01/16 1058 (!) 154/98 mmHg 98.6 F (37 C) 75 16 97 % 6' (1.829 m) 99.791 kg (220 lb)    A history and physical examination was performed in my office.  The patient was reexamined.  There have been no changes to this history and physical examination.  Samara Deist A

## 2016-02-02 ENCOUNTER — Encounter: Payer: Self-pay | Admitting: Podiatry

## 2016-02-03 LAB — SURGICAL PATHOLOGY

## 2016-02-16 ENCOUNTER — Other Ambulatory Visit: Payer: 59

## 2016-02-23 ENCOUNTER — Ambulatory Visit: Payer: 59 | Admitting: Family Medicine

## 2016-05-14 DIAGNOSIS — Z872 Personal history of diseases of the skin and subcutaneous tissue: Secondary | ICD-10-CM

## 2016-05-14 HISTORY — DX: Personal history of diseases of the skin and subcutaneous tissue: Z87.2

## 2016-08-29 DIAGNOSIS — G4733 Obstructive sleep apnea (adult) (pediatric): Secondary | ICD-10-CM | POA: Insufficient documentation

## 2016-10-10 ENCOUNTER — Other Ambulatory Visit: Payer: Self-pay | Admitting: Internal Medicine

## 2016-10-10 DIAGNOSIS — R748 Abnormal levels of other serum enzymes: Secondary | ICD-10-CM

## 2016-10-17 ENCOUNTER — Ambulatory Visit
Admission: RE | Admit: 2016-10-17 | Discharge: 2016-10-17 | Disposition: A | Discharge status: Home / Self Care | Payer: 59 | Source: Ambulatory Visit | Attending: Internal Medicine | Admitting: Internal Medicine

## 2016-10-17 DIAGNOSIS — K760 Fatty (change of) liver, not elsewhere classified: Secondary | ICD-10-CM | POA: Diagnosis not present

## 2016-10-17 DIAGNOSIS — R748 Abnormal levels of other serum enzymes: Secondary | ICD-10-CM | POA: Diagnosis present

## 2017-02-18 DIAGNOSIS — F418 Other specified anxiety disorders: Secondary | ICD-10-CM | POA: Insufficient documentation

## 2017-09-27 ENCOUNTER — Other Ambulatory Visit
Admission: RE | Admit: 2017-09-27 | Discharge: 2017-09-27 | Disposition: A | Discharge status: Home / Self Care | Payer: PRIVATE HEALTH INSURANCE | Source: Ambulatory Visit | Attending: Physician Assistant | Admitting: Physician Assistant

## 2017-09-27 DIAGNOSIS — R600 Localized edema: Secondary | ICD-10-CM | POA: Insufficient documentation

## 2017-09-27 LAB — FIBRIN DERIVATIVES D-DIMER (ARMC ONLY): Fibrin derivatives D-dimer (ARMC): 117.4 ng/mL (FEU) (ref 0.00–499.00)

## 2017-10-15 ENCOUNTER — Encounter: Payer: Self-pay | Admitting: Emergency Medicine

## 2017-10-15 ENCOUNTER — Inpatient Hospital Stay
Admission: EM | Admit: 2017-10-15 | Discharge: 2017-10-17 | DRG: 392 | Disposition: A | Discharge status: Home / Self Care | Payer: PRIVATE HEALTH INSURANCE | Attending: Surgery | Admitting: Surgery

## 2017-10-15 ENCOUNTER — Emergency Department: Payer: PRIVATE HEALTH INSURANCE

## 2017-10-15 DIAGNOSIS — Z79899 Other long term (current) drug therapy: Secondary | ICD-10-CM

## 2017-10-15 DIAGNOSIS — I10 Essential (primary) hypertension: Secondary | ICD-10-CM | POA: Diagnosis present

## 2017-10-15 DIAGNOSIS — G4730 Sleep apnea, unspecified: Secondary | ICD-10-CM | POA: Diagnosis present

## 2017-10-15 DIAGNOSIS — K5792 Diverticulitis of intestine, part unspecified, without perforation or abscess without bleeding: Principal | ICD-10-CM | POA: Diagnosis present

## 2017-10-15 DIAGNOSIS — Z8249 Family history of ischemic heart disease and other diseases of the circulatory system: Secondary | ICD-10-CM | POA: Diagnosis not present

## 2017-10-15 DIAGNOSIS — E782 Mixed hyperlipidemia: Secondary | ICD-10-CM | POA: Diagnosis present

## 2017-10-15 HISTORY — DX: Diverticulitis of intestine, part unspecified, without perforation or abscess without bleeding: K57.92

## 2017-10-15 LAB — COMPREHENSIVE METABOLIC PANEL
ALT: 51 U/L (ref 17–63)
AST: 43 U/L — AB (ref 15–41)
Albumin: 4.2 g/dL (ref 3.5–5.0)
Alkaline Phosphatase: 38 U/L (ref 38–126)
Anion gap: 11 (ref 5–15)
BUN: 15 mg/dL (ref 6–20)
CHLORIDE: 107 mmol/L (ref 101–111)
CO2: 22 mmol/L (ref 22–32)
Calcium: 9.5 mg/dL (ref 8.9–10.3)
Creatinine, Ser: 1.2 mg/dL (ref 0.61–1.24)
GFR calc Af Amer: >60 mL/min (ref >60)
GLUCOSE: 131 mg/dL — AB (ref 65–99)
POTASSIUM: 3.6 mmol/L (ref 3.5–5.1)
SODIUM: 140 mmol/L (ref 135–145)
Total Bilirubin: 0.8 mg/dL (ref 0.3–1.2)
Total Protein: 7.3 g/dL (ref 6.5–8.1)

## 2017-10-15 LAB — INFLUENZA PANEL BY PCR (TYPE A & B)
INFLAPCR: NEGATIVE
INFLBPCR: NEGATIVE

## 2017-10-15 LAB — URINALYSIS, COMPLETE (UACMP) WITH MICROSCOPIC
BACTERIA UA: NONE SEEN
BILIRUBIN URINE: NEGATIVE
Glucose, UA: NEGATIVE mg/dL
Hgb urine dipstick: NEGATIVE
Ketones, ur: NEGATIVE mg/dL
LEUKOCYTES UA: NEGATIVE
Nitrite: NEGATIVE
PROTEIN: NEGATIVE mg/dL
RBC / HPF: NONE SEEN RBC/hpf (ref 0–5)
SPECIFIC GRAVITY, URINE: 1.012 (ref 1.005–1.030)
SQUAMOUS EPITHELIAL / LPF: NONE SEEN
WBC, UA: NONE SEEN WBC/hpf (ref 0–5)
pH: 7 (ref 5.0–8.0)

## 2017-10-15 LAB — LIPASE, BLOOD: LIPASE: 28 U/L (ref 11–51)

## 2017-10-15 LAB — CBC
HEMATOCRIT: 43.3 % (ref 40.0–52.0)
Hemoglobin: 15.1 g/dL (ref 13.0–18.0)
MCH: 30.2 pg (ref 26.0–34.0)
MCHC: 34.9 g/dL (ref 32.0–36.0)
MCV: 86.4 fL (ref 80.0–100.0)
Platelets: 182 10*3/uL (ref 150–440)
RBC: 5.01 MIL/uL (ref 4.40–5.90)
RDW: 13.4 % (ref 11.5–14.5)
WBC: 10.9 10*3/uL — AB (ref 3.8–10.6)

## 2017-10-15 MED ORDER — SODIUM CHLORIDE 0.9 % IV BOLUS (SEPSIS)
1000.0000 mL | Freq: Once | INTRAVENOUS | Status: AC
  Administered 2017-10-15: 1000 mL via INTRAVENOUS

## 2017-10-15 MED ORDER — ONDANSETRON HCL 4 MG/2ML IJ SOLN: 4.0000 mg | Freq: Four times a day (QID) | INTRAMUSCULAR | Status: DC | PRN

## 2017-10-15 MED ORDER — MORPHINE SULFATE (PF) 4 MG/ML IV SOLN
4.0000 mg | Freq: Once | INTRAVENOUS | Status: AC
  Administered 2017-10-15: 4 mg via INTRAVENOUS
  Filled 2017-10-15: qty 1

## 2017-10-15 MED ORDER — IOPAMIDOL (ISOVUE-300) INJECTION 61%
100.0000 mL | Freq: Once | INTRAVENOUS | Status: AC | PRN
  Administered 2017-10-15: 100 mL via INTRAVENOUS

## 2017-10-15 MED ORDER — ALPRAZOLAM 0.5 MG PO TABS: 0.5000 mg | ORAL_TABLET | Freq: Every evening | ORAL | Status: DC | PRN

## 2017-10-15 MED ORDER — PIPERACILLIN-TAZOBACTAM 3.375 G IVPB 30 MIN: 3.3750 g | Freq: Once | INTRAVENOUS | Status: DC

## 2017-10-15 MED ORDER — HYDROMORPHONE HCL 1 MG/ML IJ SOLN
0.5000 mg | INTRAMUSCULAR | Status: DC | PRN
  Administered 2017-10-15 – 2017-10-16 (×5): 0.5 mg via INTRAVENOUS
  Filled 2017-10-15 (×4): qty 1

## 2017-10-15 MED ORDER — HYDROMORPHONE HCL 1 MG/ML IJ SOLN
INTRAMUSCULAR | Status: AC
Start: 2017-10-15 — End: 2017-10-16
  Filled 2017-10-15: qty 1

## 2017-10-15 MED ORDER — PIPERACILLIN-TAZOBACTAM 3.375 G IVPB
3.3750 g | Freq: Three times a day (TID) | INTRAVENOUS | Status: DC
  Administered 2017-10-15 – 2017-10-17 (×6): 3.375 g via INTRAVENOUS
  Filled 2017-10-15 (×5): qty 50

## 2017-10-15 MED ORDER — CYCLOBENZAPRINE HCL 10 MG PO TABS: 5.0000 mg | ORAL_TABLET | Freq: Two times a day (BID) | ORAL | Status: DC | PRN

## 2017-10-15 MED ORDER — PIPERACILLIN-TAZOBACTAM 3.375 G IVPB
INTRAVENOUS | Status: AC
  Administered 2017-10-15: 3.375 g via INTRAVENOUS
  Filled 2017-10-15: qty 50

## 2017-10-15 MED ORDER — ATORVASTATIN CALCIUM 20 MG PO TABS
10.0000 mg | ORAL_TABLET | Freq: Every day | ORAL | Status: DC
  Administered 2017-10-16 – 2017-10-17 (×2): 10 mg via ORAL
  Filled 2017-10-15 (×2): qty 1

## 2017-10-15 MED ORDER — HEPARIN SODIUM (PORCINE) 5000 UNIT/ML IJ SOLN
5000.0000 [IU] | Freq: Three times a day (TID) | INTRAMUSCULAR | Status: DC
  Administered 2017-10-15 – 2017-10-17 (×5): 5000 [IU] via SUBCUTANEOUS
  Filled 2017-10-15 (×5): qty 1

## 2017-10-15 MED ORDER — IOPAMIDOL (ISOVUE-300) INJECTION 61%
30.0000 mL | Freq: Once | INTRAVENOUS | Status: AC | PRN
  Administered 2017-10-15: 30 mL via ORAL

## 2017-10-15 MED ORDER — KETOROLAC TROMETHAMINE 30 MG/ML IJ SOLN
30.0000 mg | Freq: Once | INTRAMUSCULAR | Status: AC
  Administered 2017-10-15: 30 mg via INTRAVENOUS
  Filled 2017-10-15: qty 1

## 2017-10-15 MED ORDER — AMLODIPINE BESYLATE 5 MG PO TABS
5.0000 mg | ORAL_TABLET | Freq: Every day | ORAL | Status: DC
  Administered 2017-10-15 – 2017-10-17 (×3): 5 mg via ORAL
  Filled 2017-10-15 (×3): qty 1

## 2017-10-15 MED ORDER — LACTATED RINGERS IV SOLN
INTRAVENOUS | Status: DC
  Administered 2017-10-15 – 2017-10-17 (×6): via INTRAVENOUS

## 2017-10-15 MED ORDER — ONDANSETRON HCL 4 MG PO TABS: 4.0000 mg | ORAL_TABLET | Freq: Four times a day (QID) | ORAL | Status: DC | PRN

## 2017-10-15 MED ORDER — ONDANSETRON HCL 4 MG/2ML IJ SOLN
4.0000 mg | Freq: Once | INTRAMUSCULAR | Status: AC
  Administered 2017-10-15: 4 mg via INTRAVENOUS
  Filled 2017-10-15: qty 2

## 2017-10-15 NOTE — ED Notes (Signed)
Patient transported to CT 

## 2017-10-15 NOTE — ED Notes (Signed)
Password set up per patient: Cory Johnston

## 2017-10-15 NOTE — Plan of Care (Signed)
  Progressing Education: Knowledge of General Education information will improve 10/15/2017 2230 - Progressing by Kaylie Ritter, Floyce Stakes, RN

## 2017-10-15 NOTE — Progress Notes (Signed)
ANTIBIOTIC CONSULT NOTE - INITIAL  Pharmacy Consult for Zosyn Indication: IAI  No Known Allergies  Patient Measurements: Height: 6' 0.01" (182.9 cm) Weight: 220 lb (99.8 kg) IBW/kg (Calculated) : 77.62 Adjusted Body Weight:   Vital Signs: Temp: 99.4 F (37.4 C) (02/19 1403) Temp Source: Oral (02/19 1403) BP: 167/83 (02/19 1403) Pulse Rate: 97 (02/19 1403) Intake/Output from previous day: No intake/output data recorded. Intake/Output from this shift: No intake/output data recorded.  Labs: Recent Labs    10/15/17 1401  WBC 10.9*  HGB 15.1  PLT 182  CREATININE 1.20   Estimated Creatinine Clearance: 92.1 mL/min (by C-G formula based on SCr of 1.2 mg/dL). No results for input(s): VANCOTROUGH, VANCOPEAK, VANCORANDOM, GENTTROUGH, GENTPEAK, GENTRANDOM, TOBRATROUGH, TOBRAPEAK, TOBRARND, AMIKACINPEAK, AMIKACINTROU, AMIKACIN in the last 72 hours.   Microbiology: No results found for this or any previous visit (from the past 720 hour(s)).  Medical History: Past Medical History:  Diagnosis Date  . Anxiety   . Complication of anesthesia   . Elevated ferritin 08/04/2015  . Gout    hx of  . Hallux valgus of left foot   . Hyperlipemia, mixed   . Hypertension    controlled on meds  . PONV (postoperative nausea and vomiting)   . Sleep apnea    cpap    Medications:  Infusions:  . piperacillin-tazobactam (ZOSYN)  IV    . piperacillin-tazobactam     Assessment: 46 yom cc abdominal pain with PMH HTN, anxiety, Gout, hallux valgus left foot, HLD, PONV, OSA on CPAP. Pharmacy consulted to dose Zosyn for IAI  Goal of Therapy:  Resolve infection Prevent ADE  Plan:  Zosyn 3.375 gm IV Q8H EI  Laural Benes, Pharm.D., BCPS Clinical Pharmacist 10/15/2017,5:08 PM

## 2017-10-15 NOTE — H&P (Signed)
Cory Johnston is an 47 y.o. male.    Chief Complaint: Lower abdominal pain  HPI: This patient with a gradual onset of lower abdominal pain he initially stated that it started yesterday but now states that he has not been feeling well for the last 3-4 days.  He has had some diarrhea but no melena or hematochezia no fevers or chills mild nausea but is been able to eat no vomiting.  He is never had an episode like this before.  He has no family history of colon cancer Crohn's disease or ulcerative colitis no diverticulitis.  He works for the [employer] [place] [employer] does not smoke but drinks alcohol just not every day.  Past Medical History:  Diagnosis Date  . Anxiety   . Complication of anesthesia   . Elevated ferritin 08/04/2015  . Gout    hx of  . Hallux valgus of left foot   . Hyperlipemia, mixed   . Hypertension    controlled on meds  . PONV (postoperative nausea and vomiting)   . Sleep apnea    cpap    Past Surgical History:  Procedure Laterality Date  . Barbie Banner OSTEOTOMY Left 02/01/2016   Procedure: Barbie Banner OSTEOTOMY AUSTIN LEFT 1ST METATARSAL;  Surgeon: Samara Deist, DPM;  Location: [facility];  Service: Podiatry;  Laterality: Left;  WITH POPLITEAL CPAP  . ANKLE FRACTURE SURGERY Left   . TONSILLECTOMY AND ADENOIDECTOMY    . TRIGGER FINGER RELEASE    . VASECTOMY      Family History  Problem Relation Age of Onset  . Hypertension Father   . Stroke Father   . CAD Father   . Hyperlipidemia Father   . Diabetes Father   . Stroke Paternal Aunt    Social History:  reports that  has never smoked. he has never used smokeless tobacco. He reports that he drinks about 8.4 oz of alcohol per week. He reports that he does not use drugs.  Allergies: No Known Allergies   (Not in a hospital admission)   Review of Systems  Constitutional: Negative.   HENT: Negative.   Eyes: Negative.   Respiratory: Negative.   Cardiovascular: Negative.   Gastrointestinal: Positive for abdominal  pain, diarrhea and nausea. Negative for blood in stool, constipation, heartburn, melena and vomiting.  Genitourinary: Negative.   Musculoskeletal: Negative.   Skin: Negative.   Neurological: Negative.   Endo/Heme/Allergies: Negative.   Psychiatric/Behavioral: Negative.      Physical Exam:  BP (!) 167/83 (BP Location: Left Arm)   Pulse 97   Temp 99.4 F (37.4 C) (Oral)   Resp 20   Ht 6' 0.01" (1.829 m)   Wt 220 lb (99.8 kg)   SpO2 98%   BMI 29.83 kg/m   Physical Exam  Constitutional: He is oriented to person, place, and time and well-developed, well-nourished, and in no distress. No distress.  HENT:  Head: Normocephalic and atraumatic.  Eyes: Pupils are equal, round, and reactive to light. Right eye exhibits no discharge. Left eye exhibits no discharge. No scleral icterus.  Neck: Normal range of motion. No JVD present.  Cardiovascular: Normal rate, regular rhythm and normal heart sounds.  Pulmonary/Chest: Effort normal and breath sounds normal. No respiratory distress. He has no wheezes. He has no rales.  Abdominal: Soft. Bowel sounds are normal. He exhibits no distension. There is tenderness. There is guarding. There is no rebound.  Tenderness maximal at the suprapubic area and to both lower quadrants.  Minimal guarding no rebound  or percussion tenderness  Musculoskeletal: He exhibits no edema, tenderness or deformity.  Lymphadenopathy:    He has no cervical adenopathy.  Neurological: He is alert and oriented to person, place, and time.  Skin: Skin is warm and dry. He is not diaphoretic. No erythema.  Psychiatric: Mood and affect normal.  Vitals reviewed.       Results for orders placed or performed during the hospital encounter of 10/15/17 (from the past 48 hour(s))  Lipase, blood     Status: None   Collection Time: 10/15/17  2:01 PM  Result Value Ref Range   Lipase 28 11 - 51 U/L    Comment: Performed at [facility], [address] [place], [place]  [address]  Comprehensive metabolic panel     Status: Abnormal   Collection Time: 10/15/17  2:01 PM  Result Value Ref Range   Sodium 140 135 - 145 mmol/L   Potassium 3.6 3.5 - 5.1 mmol/L   Chloride 107 101 - 111 mmol/L   CO2 22 22 - 32 mmol/L   Glucose, Bld 131 (H) 65 - 99 mg/dL   BUN 15 6 - 20 mg/dL   Creatinine, Ser 1.20 0.61 - 1.24 mg/dL   Calcium 9.5 8.9 - 10.3 mg/dL   Total Protein 7.3 6.5 - 8.1 g/dL   Albumin 4.2 3.5 - 5.0 g/dL   AST 43 (H) 15 - 41 U/L   ALT 51 17 - 63 U/L   Alkaline Phosphatase 38 38 - 126 U/L   Total Bilirubin 0.8 0.3 - 1.2 mg/dL   GFR calc non Af Amer >60 >60 mL/min   GFR calc Af Amer >60 >60 mL/min    Comment: (NOTE) The eGFR has been calculated using the CKD EPI equation. This calculation has not been validated in all clinical situations. eGFR's persistently <60 mL/min signify possible Chronic Kidney Disease.    Anion gap 11 5 - 15    Comment: Performed at [facility], [address] [place], [place] [address]  CBC     Status: Abnormal   Collection Time: 10/15/17  2:01 PM  Result Value Ref Range   WBC 10.9 (H) 3.8 - 10.6 K/uL   RBC 5.01 4.40 - 5.90 MIL/uL   Hemoglobin 15.1 13.0 - 18.0 g/dL   HCT 43.3 40.0 - 52.0 %   MCV 86.4 80.0 - 100.0 fL   MCH 30.2 26.0 - 34.0 pg   MCHC 34.9 32.0 - 36.0 g/dL   RDW 13.4 11.5 - 14.5 %   Platelets 182 150 - 440 K/uL    Comment: Performed at [facility], [address] [place], [place] [address]  Urinalysis, Complete w Microscopic     Status: Abnormal   Collection Time: 10/15/17  2:01 PM  Result Value Ref Range   Color, Urine YELLOW (A) YELLOW   APPearance TURBID (A) CLEAR   Specific Gravity, Urine 1.012 1.005 - 1.030   pH 7.0 5.0 - 8.0   Glucose, UA NEGATIVE NEGATIVE mg/dL   Hgb urine dipstick NEGATIVE NEGATIVE   Bilirubin Urine NEGATIVE NEGATIVE   Ketones, ur NEGATIVE NEGATIVE mg/dL   Protein, ur NEGATIVE NEGATIVE mg/dL   Nitrite NEGATIVE NEGATIVE   Leukocytes, UA NEGATIVE NEGATIVE    RBC / HPF NONE SEEN 0 - 5 RBC/hpf   WBC, UA NONE SEEN 0 - 5 WBC/hpf   Bacteria, UA NONE SEEN NONE SEEN   Squamous Epithelial / LPF NONE SEEN NONE SEEN   Amorphous Crystal PRESENT  Comment: Performed at [facility], [address] [place], [place] [address]  Influenza panel by PCR (type A & B)     Status: None   Collection Time: 10/15/17  4:06 PM  Result Value Ref Range   Influenza A By PCR NEGATIVE NEGATIVE   Influenza B By PCR NEGATIVE NEGATIVE    Comment: (NOTE) The Xpert Xpress Flu assay is intended as an aid in the diagnosis of  influenza and should not be used as a sole basis for treatment.  This  assay is FDA approved for nasopharyngeal swab specimens only. Nasal  washings and aspirates are unacceptable for Xpert Xpress Flu testing. Performed at [facility], [address], [place], [place] [address]    Ct Abdomen Pelvis W Contrast  Result Date: 10/15/2017 CLINICAL DATA:  47 year old male with right lower quadrant pain. Initial encounter. EXAM: CT ABDOMEN AND PELVIS WITH CONTRAST TECHNIQUE: Multidetector CT imaging of the abdomen and pelvis was performed using the standard protocol following bolus administration of intravenous contrast. CONTRAST:  136m ISOVUE-300 IOPAMIDOL (ISOVUE-300) INJECTION 61% COMPARISON:  None. FINDINGS: Lower chest: Basilar subsegmental atelectasis. Heart size top-normal. Hepatobiliary: Enlarged fatty liver spanning over 19.8 cm. No worrisome hepatic lesion. Contracted gallbladder without calcified gallstones. Pancreas: No pancreatic mass or inflammation. Spleen: Splenomegaly with spleen spanning over 14.5 cm. No focal splenic lesion. Adrenals/Urinary Tract: No obstructing stone or hydronephrosis. Extra renal pelvis on the right. Left renal 1.2 cm cyst. Left lower pole tiny nonobstructing stone. No adrenal mass. Compression of posterior aspect of the urinary bladder by diverticulitis and inflammatory process. Noncontrast filled views  of the urinary bladder without polypoid abnormality noted. Stomach/Bowel: Diffuse inflammatory process surrounding the sigmoid colon extending to involving sigmoid mesentery probably related to diverticulitis. Circumferential thickening of the inflamed sigmoid colon. This can be assessed on follow-up to exclude underlying mass. No drainable abscess. No gross free intraperitoneal air. The inflammatory process causes mass effect upon the posterior aspect of the urinary bladder. Otherwise no primary bowel inflammatory process identified. Vascular/Lymphatic: No aortic aneurysm. Atherosclerotic changes iliac arteries. No large vessel occlusion. No adenopathy. Reproductive: Top-normal size prostate gland. Other: No bowel containing hernia. Musculoskeletal: Mild degenerative changes lower thoracic lumbar spine. No worrisome osseous abnormality. IMPRESSION: Sigmoid diverticulitis suspected as detailed above. No drainable abscess. No gross free intraperitoneal air. The inflammatory process causes mass effect upon the posterior aspect of the urinary bladder. Enlarged fatty liver spanning over 19.8 cm. Splenomegaly with spleen spanning over 14.5 cm. Left renal 1.2 cm cyst. Left lower pole tiny nonobstructing renal calculi. These results were called by telephone at the time of interpretation on 10/15/2017 at 4:46 pm to Dr. DLarae Grooms, who verbally acknowledged these results. Electronically Signed   By: SGenia DelM.D.   On: 10/15/2017 16:49     Assessment/Plan  CT scans personally reviewed.  There is a loop of sigmoid colon that appears to be concentrically inflamed unlike typical diverticulitis.  It is certainly in the location for diverticulitis however.  There is no history in his family of Crohn's or ulcerative colitis etc.  I suspect that this is just simple diverticulitis and with that in mind I would recommend admitting the hospital and treating with IV antibiotics and transition to oral antibiotics.  He  will ultimately require a colonoscopy which she is never had in order to diagnose this accurately.  This is discussed with he and his wife they understood and agreed with this plan.  Also discussed with emergency room  physician.  Florene Glen, MD, FACS

## 2017-10-15 NOTE — ED Provider Notes (Signed)
[facility] Emergency Department Provider Note  ___________________________________________   First MD Initiated Contact with Patient 10/15/17 1539     (approximate)  I have reviewed the triage vital signs and the nursing notes.   HISTORY  Chief Complaint Abdominal Pain   HPI Cory Johnston is a 47 y.o. male with a history of hypertension who is presenting to the emergency department with suprapubic as well as right lower quadrant abdominal pain over the past day.  He reports he had a subjective fever yesterday.  Said he had one episode of diarrhea today.  Says that the pain is an 8 out of 10 at this time and twisting and radiating down into his testicles and through to his back.  Denies having any previous history of abdominal surgery.  Past Medical History:  Diagnosis Date  . Anxiety   . Complication of anesthesia   . Elevated ferritin 08/04/2015  . Gout    hx of  . Hallux valgus of left foot   . Hyperlipemia, mixed   . Hypertension    controlled on meds  . PONV (postoperative nausea and vomiting)   . Sleep apnea    cpap    Patient Active Problem List   Diagnosis Date Noted  . Elevated ferritin 08/04/2015    Past Surgical History:  Procedure Laterality Date  . Barbie Banner OSTEOTOMY Left 02/01/2016   Procedure: Barbie Banner OSTEOTOMY [place] LEFT 1ST METATARSAL;  Surgeon: Samara Deist, DPM;  Location: [place];  Service: Podiatry;  Laterality: Left;  WITH POPLITEAL CPAP  . ANKLE FRACTURE SURGERY Left   . TONSILLECTOMY AND ADENOIDECTOMY    . TRIGGER FINGER RELEASE    . VASECTOMY      Prior to Admission medications   Medication Sig Start Date End Date Taking? Authorizing Provider  ALPRAZolam Duanne Moron) 0.5 MG tablet Take 0.5 mg by mouth at bedtime as needed for anxiety.    [provider]  amLODipine (NORVASC) 5 MG tablet Take 5 mg by mouth daily. pm    [provider]  atorvastatin (LIPITOR) 10 MG tablet Take 10 mg by mouth  daily.    [provider]  colchicine 0.6 MG tablet Take 0.6 mg by mouth daily as needed.    [provider]  cyclobenzaprine (FLEXERIL) 5 MG tablet Take 5 mg by mouth 2 (two) times daily as needed for muscle spasms.    [provider]  Fenofibrate 150 MG CAPS Take 1 capsule by mouth daily.    [provider]  oxyCODONE-acetaminophen (PERCOCET) 7.5-325 MG tablet 1-2 tablets every 4-6 hours prn pain 02/01/16   Samara Deist, DPM  testosterone (ANDROGEL) 50 MG/5GM (1%) GEL Place 5 g onto the skin daily. 4 pumps    [provider]    Allergies Patient has no known allergies.  Family History  Problem Relation Age of Onset  . Hypertension Father   . Stroke Father   . CAD Father   . Hyperlipidemia Father   . Diabetes Father   . Stroke Paternal Aunt     Social History Social History   Tobacco Use  . Smoking status: Never Smoker  . Smokeless tobacco: Never Used  Substance Use Topics  . Alcohol use: Yes    Alcohol/week: 8.4 oz    Types: 12 Cans of beer, 2 Shots of liquor per week    Comment: 3 x week beer and liquor  . Drug use: No    Review of Systems  Constitutional: No  fever/chills Eyes: No visual changes. ENT: No sore throat. Cardiovascular: Denies chest pain. Respiratory: Denies shortness of breath. Gastrointestinal: no vomiting.  No constipation. Genitourinary: Negative for dysuria. Musculoskeletal: Negative for back pain. Skin: Negative for rash. Neurological: Negative for headaches, focal weakness or numbness.   ____________________________________________   PHYSICAL EXAM:  VITAL SIGNS: ED Triage Vitals [10/15/17 1403]  Enc Vitals Group     BP (!) 167/83     Pulse Rate 97     Resp 20     Temp 99.4 F (37.4 C)     Temp Source Oral     SpO2 98 %     Weight 220 lb (99.8 kg)     Height      Head Circumference      Peak Flow      Pain Score 4     Pain Loc      Pain Edu?      Excl. in [place]?      Constitutional: Alert and oriented. Well appearing and in no acute distress. Eyes: Conjunctivae are normal.  Head: Atraumatic. Nose: No congestion/rhinnorhea. Mouth/Throat: Mucous membranes are moist.  Neck: No stridor.   Cardiovascular: Normal rate, regular rhythm. Grossly normal heart sounds.   Respiratory: Normal respiratory effort.  No retractions. Lungs CTAB. Gastrointestinal: Soft with moderate suprapubic as well as right lower quadrant tenderness to palpation.  No rebound or guarding. No distention. No CVA tenderness. Genitourinary: Normal external appearance in this circumcised male.  Minimal tenderness to palpation to the right testicle as well as spermatic cord without any severe tenderness to palpation.  Normal lie of the testicles.  No induration or erythema of the scrotum. Musculoskeletal: No lower extremity tenderness nor edema.  No joint effusions. Neurologic:  Normal speech and language. No gross focal neurologic deficits are appreciated. Skin:  Skin is warm, dry and intact. No rash noted. Psychiatric: Mood and affect are normal. Speech and behavior are normal.  ____________________________________________   LABS (all labs ordered are listed, but only abnormal results are displayed)  Labs Reviewed  COMPREHENSIVE METABOLIC PANEL - Abnormal; Notable for the following components:      Result Value   Glucose, Bld 131 (*)    AST 43 (*)    All other components within normal limits  CBC - Abnormal; Notable for the following components:   WBC 10.9 (*)    All other components within normal limits  URINALYSIS, COMPLETE (UACMP) WITH MICROSCOPIC - Abnormal; Notable for the following components:   Color, Urine YELLOW (*)    APPearance TURBID (*)    All other components within normal limits  LIPASE, BLOOD  INFLUENZA PANEL BY PCR (TYPE A & B)   ____________________________________________  EKG   ____________________________________________  RADIOLOGY  Sigmoid  diverticulitis with severe mesenteric stranding concerning to the point of possible microperforation. ____________________________________________   PROCEDURES  Procedure(s) performed:   Procedures  Critical Care performed:   ____________________________________________   INITIAL IMPRESSION / ASSESSMENT AND PLAN / ED COURSE  Pertinent labs & imaging results that were available during my care of the patient were reviewed by me and considered in my medical decision making (see chart for details).  Differential diagnosis includes, but is not limited to, acute appendicitis, renal colic, testicular torsion, urinary tract infection/pyelonephritis, prostatitis,  epididymitis, diverticulitis, small bowel obstruction or ileus, colitis, abdominal aortic aneurysm, gastroenteritis, hernia, etc. As part of my medical decision making, I reviewed the following data within the [place] chart reviewed  -----------------------------------------  5:05 PM on 10/15/2017 -----------------------------------------  Discussed case with Dr. Burt Knack who will be admitting the patient.  The patient as well as family are also aware of the treatment plan as well as need for admission to the hospital.  They are understanding and willing to comply.  ____________________________________________   FINAL CLINICAL IMPRESSION(S) / ED DIAGNOSES  Diverticulitis.    NEW MEDICATIONS STARTED DURING THIS VISIT:  New Prescriptions   No medications on file     Note:  This document was prepared using Dragon voice recognition software and may include unintentional dictation errors.     Orbie Pyo, MD 10/15/17 [phone]

## 2017-10-15 NOTE — ED Triage Notes (Signed)
Pt sent over from [facility] for further eval of RLQ pain, tender to touch and hurts when walking since Sunday with diarrhea and on and off fevers.

## 2017-10-16 ENCOUNTER — Other Ambulatory Visit: Payer: Self-pay

## 2017-10-16 LAB — CBC
HEMATOCRIT: 41.1 % (ref 40.0–52.0)
HEMOGLOBIN: 14.3 g/dL (ref 13.0–18.0)
MCH: 30.2 pg (ref 26.0–34.0)
MCHC: 34.9 g/dL (ref 32.0–36.0)
MCV: 86.5 fL (ref 80.0–100.0)
Platelets: 177 10*3/uL (ref 150–440)
RBC: 4.74 MIL/uL (ref 4.40–5.90)
RDW: 13.5 % (ref 11.5–14.5)
WBC: 9.1 10*3/uL (ref 3.8–10.6)

## 2017-10-16 LAB — BASIC METABOLIC PANEL
Anion gap: 6 (ref 5–15)
BUN: 12 mg/dL (ref 6–20)
CHLORIDE: 106 mmol/L (ref 101–111)
CO2: 28 mmol/L (ref 22–32)
Calcium: 8.8 mg/dL — ABNORMAL LOW (ref 8.9–10.3)
Creatinine, Ser: 1.22 mg/dL (ref 0.61–1.24)
GFR calc non Af Amer: >60 mL/min (ref >60)
Glucose, Bld: 108 mg/dL — ABNORMAL HIGH (ref 65–99)
POTASSIUM: 4.1 mmol/L (ref 3.5–5.1)
SODIUM: 140 mmol/L (ref 135–145)

## 2017-10-16 NOTE — Progress Notes (Signed)
CC: Acute diverticulitis Subjective: This patient with acute diverticulitis without signs of perforation.  Patient feels better today with less pain no nausea or vomiting.  Tolerating a clear liquid diet.  Objective: Vital signs in last 24 hours: Temp:  [98.5 F (36.9 C)-99.5 F (37.5 C)] 98.5 F (36.9 C) (02/20 0434) Pulse Rate:  [57-97] 57 (02/20 0434) Resp:  [14-20] 15 (02/20 0434) BP: (99-167)/(61-98) 99/65 (02/20 0434) SpO2:  [94 %-100 %] 95 % (02/20 0434) Weight:  [220 lb (99.8 kg)] 220 lb (99.8 kg) (02/19 1403) Last BM Date: 10/15/17  Intake/Output from previous day: 02/19 0701 - 02/20 0700 In: 1030 [P.O.:360; I.V.:670] Out: 1175 [Urine:1175] Intake/Output this shift: Total I/O In: 997 [I.V.:956; IV Piggyback:41] Out: 800 [Urine:800]  Physical exam:  Vital signs reviewed stable afebrile abdomen is distended minimally tender in the suprapubic area without peritoneal signs this tenderness extends to the left lower quadrant.  Nontender calves no icterus no jaundice  Lab Results: CBC  Recent Labs    10/15/17 1401 10/16/17 0505  WBC 10.9* 9.1  HGB 15.1 14.3  HCT 43.3 41.1  PLT 182 177   BMET Recent Labs    10/15/17 1401 10/16/17 0505  NA 140 140  K 3.6 4.1  CL 107 106  CO2 22 28  GLUCOSE 131* 108*  BUN 15 12  CREATININE 1.20 1.22  CALCIUM 9.5 8.8*   PT/INR No results for input(s): LABPROT, INR in the last 72 hours. ABG No results for input(s): PHART, HCO3 in the last 72 hours.  Invalid input(s): PCO2, PO2  Studies/Results: Ct Abdomen Pelvis W Contrast  Result Date: 10/15/2017 CLINICAL DATA:  47 year old male with right lower quadrant pain. Initial encounter. EXAM: CT ABDOMEN AND PELVIS WITH CONTRAST TECHNIQUE: Multidetector CT imaging of the abdomen and pelvis was performed using the standard protocol following bolus administration of intravenous contrast. CONTRAST:  139mL ISOVUE-300 IOPAMIDOL (ISOVUE-300) INJECTION 61% COMPARISON:  None. FINDINGS:  Lower chest: Basilar subsegmental atelectasis. Heart size top-normal. Hepatobiliary: Enlarged fatty liver spanning over 19.8 cm. No worrisome hepatic lesion. Contracted gallbladder without calcified gallstones. Pancreas: No pancreatic mass or inflammation. Spleen: Splenomegaly with spleen spanning over 14.5 cm. No focal splenic lesion. Adrenals/Urinary Tract: No obstructing stone or hydronephrosis. Extra renal pelvis on the right. Left renal 1.2 cm cyst. Left lower pole tiny nonobstructing stone. No adrenal mass. Compression of posterior aspect of the urinary bladder by diverticulitis and inflammatory process. Noncontrast filled views of the urinary bladder without polypoid abnormality noted. Stomach/Bowel: Diffuse inflammatory process surrounding the sigmoid colon extending to involving sigmoid mesentery probably related to diverticulitis. Circumferential thickening of the inflamed sigmoid colon. This can be assessed on follow-up to exclude underlying mass. No drainable abscess. No gross free intraperitoneal air. The inflammatory process causes mass effect upon the posterior aspect of the urinary bladder. Otherwise no primary bowel inflammatory process identified. Vascular/Lymphatic: No aortic aneurysm. Atherosclerotic changes iliac arteries. No large vessel occlusion. No adenopathy. Reproductive: Top-normal size prostate gland. Other: No bowel containing hernia. Musculoskeletal: Mild degenerative changes lower thoracic lumbar spine. No worrisome osseous abnormality. IMPRESSION: Sigmoid diverticulitis suspected as detailed above. No drainable abscess. No gross free intraperitoneal air. The inflammatory process causes mass effect upon the posterior aspect of the urinary bladder. Enlarged fatty liver spanning over 19.8 cm. Splenomegaly with spleen spanning over 14.5 cm. Left renal 1.2 cm cyst. Left lower pole tiny nonobstructing renal calculi. These results were called by telephone at the time of interpretation on  10/15/2017 at 4:46 pm to Dr. Shanon Brow  SCHAEVITZ , who verbally acknowledged these results. Electronically Signed   By: Genia Del M.D.   On: 10/15/2017 16:49    Anti-infectives: Anti-infectives (From admission, onward)   Start     Dose/Rate Route Frequency Ordered Stop   10/15/17 1715  piperacillin-tazobactam (ZOSYN) IVPB 3.375 g     3.375 g 12.5 mL/hr over 240 Minutes Intravenous Every 8 hours 10/15/17 1707     10/15/17 1715  piperacillin-tazobactam (ZOSYN) IVPB 3.375 g  Status:  Discontinued     3.375 g 100 mL/hr over 30 Minutes Intravenous  Once 10/15/17 1707 10/15/17 1737      Assessment/Plan:  White blood cell count has normalized.  Patient currently on Zosyn with adequate anaerobic coverage and he is showing signs of improvement.  Continue IV antibiotics today and possibly discharge tomorrow.  Florene Glen, MD, FACS  10/16/2017

## 2017-10-17 DIAGNOSIS — I10 Essential (primary) hypertension: Secondary | ICD-10-CM | POA: Insufficient documentation

## 2017-10-17 DIAGNOSIS — E291 Testicular hypofunction: Secondary | ICD-10-CM | POA: Insufficient documentation

## 2017-10-17 DIAGNOSIS — E7800 Pure hypercholesterolemia, unspecified: Secondary | ICD-10-CM | POA: Insufficient documentation

## 2017-10-17 LAB — HIV ANTIBODY (ROUTINE TESTING W REFLEX): HIV Screen 4th Generation wRfx: NONREACTIVE

## 2017-10-17 MED ORDER — METRONIDAZOLE 500 MG PO TABS: 500.0000 mg | ORAL_TABLET | Freq: Three times a day (TID) | ORAL | 1 refills | Status: DC

## 2017-10-17 MED ORDER — CIPROFLOXACIN HCL 500 MG PO TABS: 500.0000 mg | ORAL_TABLET | Freq: Two times a day (BID) | ORAL | 1 refills | Status: DC

## 2017-10-17 MED ORDER — OXYCODONE-ACETAMINOPHEN 7.5-325 MG PO TABS: ORAL_TABLET | ORAL | 0 refills | Status: DC

## 2017-10-17 NOTE — Progress Notes (Signed)
CC: Acute diverticulitis Subjective: Patient feels much better today over the day of admission.  He has less pain no nausea vomiting no fevers or chills.  Feels he may be ready for discharge  Objective: Vital signs in last 24 hours: Temp:  [97.7 F (36.5 C)-98 F (36.7 C)] 98 F (36.7 C) (02/21 0545) Pulse Rate:  [48-63] 48 (02/21 0545) Resp:  [17-20] 18 (02/21 0545) BP: (102-120)/(63-70) 102/63 (02/21 0545) SpO2:  [94 %-97 %] 94 % (02/21 0545) Last BM Date: 10/16/17  Intake/Output from previous day: 02/20 0701 - 02/21 0700 In: 6198 [P.O.:1740; I.V.:4269; IV Piggyback:189] Out: 5200 [Urine:5200] Intake/Output this shift: Total I/O In: -  Out: 575 [Urine:575]  Physical exam:  Vital signs are stable afebrile abdomen is soft nondistended nontympanitic minimally tender in the suprapubic area much improved no peritoneal signs calves are nontender no icterus no jaundice  Lab Results: CBC  Recent Labs    10/15/17 1401 10/16/17 0505  WBC 10.9* 9.1  HGB 15.1 14.3  HCT 43.3 41.1  PLT 182 177   BMET Recent Labs    10/15/17 1401 10/16/17 0505  NA 140 140  K 3.6 4.1  CL 107 106  CO2 22 28  GLUCOSE 131* 108*  BUN 15 12  CREATININE 1.20 1.22  CALCIUM 9.5 8.8*   PT/INR No results for input(s): LABPROT, INR in the last 72 hours. ABG No results for input(s): PHART, HCO3 in the last 72 hours.  Invalid input(s): PCO2, PO2  Studies/Results: Ct Abdomen Pelvis W Contrast  Result Date: 10/15/2017 CLINICAL DATA:  47 year old male with right lower quadrant pain. Initial encounter. EXAM: CT ABDOMEN AND PELVIS WITH CONTRAST TECHNIQUE: Multidetector CT imaging of the abdomen and pelvis was performed using the standard protocol following bolus administration of intravenous contrast. CONTRAST:  115mL ISOVUE-300 IOPAMIDOL (ISOVUE-300) INJECTION 61% COMPARISON:  None. FINDINGS: Lower chest: Basilar subsegmental atelectasis. Heart size top-normal. Hepatobiliary: Enlarged fatty liver  spanning over 19.8 cm. No worrisome hepatic lesion. Contracted gallbladder without calcified gallstones. Pancreas: No pancreatic mass or inflammation. Spleen: Splenomegaly with spleen spanning over 14.5 cm. No focal splenic lesion. Adrenals/Urinary Tract: No obstructing stone or hydronephrosis. Extra renal pelvis on the right. Left renal 1.2 cm cyst. Left lower pole tiny nonobstructing stone. No adrenal mass. Compression of posterior aspect of the urinary bladder by diverticulitis and inflammatory process. Noncontrast filled views of the urinary bladder without polypoid abnormality noted. Stomach/Bowel: Diffuse inflammatory process surrounding the sigmoid colon extending to involving sigmoid mesentery probably related to diverticulitis. Circumferential thickening of the inflamed sigmoid colon. This can be assessed on follow-up to exclude underlying mass. No drainable abscess. No gross free intraperitoneal air. The inflammatory process causes mass effect upon the posterior aspect of the urinary bladder. Otherwise no primary bowel inflammatory process identified. Vascular/Lymphatic: No aortic aneurysm. Atherosclerotic changes iliac arteries. No large vessel occlusion. No adenopathy. Reproductive: Top-normal size prostate gland. Other: No bowel containing hernia. Musculoskeletal: Mild degenerative changes lower thoracic lumbar spine. No worrisome osseous abnormality. IMPRESSION: Sigmoid diverticulitis suspected as detailed above. No drainable abscess. No gross free intraperitoneal air. The inflammatory process causes mass effect upon the posterior aspect of the urinary bladder. Enlarged fatty liver spanning over 19.8 cm. Splenomegaly with spleen spanning over 14.5 cm. Left renal 1.2 cm cyst. Left lower pole tiny nonobstructing renal calculi. These results were called by telephone at the time of interpretation on 10/15/2017 at 4:46 pm to Dr. Larae Grooms , who verbally acknowledged these results. Electronically Signed    By: Remo Lipps  Jeannine Kitten M.D.   On: 10/15/2017 16:49    Anti-infectives: Anti-infectives (From admission, onward)   Start     Dose/Rate Route Frequency Ordered Stop   10/15/17 1715  piperacillin-tazobactam (ZOSYN) IVPB 3.375 g     3.375 g 12.5 mL/hr over 240 Minutes Intravenous Every 8 hours 10/15/17 1707     10/15/17 1715  piperacillin-tazobactam (ZOSYN) IVPB 3.375 g  Status:  Discontinued     3.375 g 100 mL/hr over 30 Minutes Intravenous  Once 10/15/17 1707 10/15/17 1737      Assessment/Plan:  Afebrile with normal white blood cell count yesterday.  Will advance diet this morning and plan discharge later this afternoon after he receives another dose of IV antibiotics.  He will go home on Cipro Flagyl follow-up in my office next week.  Florene Glen, MD, FACS  10/17/2017

## 2017-10-17 NOTE — Progress Notes (Signed)
Cory Johnston  A and O x 4. VSS. Pt tolerating diet well. No complaints of pain or nausea. IV removed intact, prescriptions given. Pt voiced understanding of discharge instructions with no further questions. Pt discharged home.    Allergies as of 10/17/2017   No Known Allergies     Medication List    TAKE these medications   ALPRAZolam 0.5 MG tablet Commonly known as:  XANAX Take 0.5 mg by mouth at bedtime as needed for anxiety.   amLODipine 5 MG tablet Commonly known as:  NORVASC Take 5 mg by mouth daily.   aspirin EC 81 MG tablet Take 81 mg by mouth daily.   Biotin 1000 MCG tablet Take 1,000 mcg by mouth daily.   ciprofloxacin 500 MG tablet Commonly known as:  CIPRO Take 1 tablet (500 mg total) by mouth 2 (two) times daily.   escitalopram 5 MG tablet Commonly known as:  LEXAPRO Take 2.5 mg by mouth daily.   fenofibrate 160 MG tablet Take 160 mg by mouth daily.   furosemide 20 MG tablet Commonly known as:  LASIX Take 20 mg by mouth daily as needed.   ibuprofen 200 MG tablet Commonly known as:  ADVIL,MOTRIN Take by mouth.   Krill Oil 1000 MG Caps Take 3,000 mg by mouth daily.   metroNIDAZOLE 500 MG tablet Commonly known as:  FLAGYL Take 1 tablet (500 mg total) by mouth 3 (three) times daily.   MULTIVITAMIN MEN Tabs Take 1 tablet by mouth daily.   niacin 500 MG tablet Commonly known as:  SLO-NIACIN Take 500 mg by mouth at bedtime.   oxyCODONE-acetaminophen 7.5-325 MG tablet Commonly known as:  PERCOCET 1-2 tablets every 4-6 hours prn pain   testosterone cypionate 200 MG/ML injection Commonly known as:  DEPOTESTOSTERONE CYPIONATE Inject 1 mL into the muscle every 14 (fourteen) days.   tiZANidine 4 MG tablet Commonly known as:  ZANAFLEX Take by mouth.       Vitals:   10/17/17 0927 10/17/17 1307  BP: 139/74 121/62  Pulse: 63 72  Resp: 18 18  Temp: 98.3 F (36.8 C) 98.1 F (36.7 C)  SpO2: 100% 100%    Francesco Sor

## 2017-10-17 NOTE — Discharge Summary (Signed)
Physician Discharge Summary  Patient ID: Cory Johnston MRN: [id number] DOB/AGE: [birth year] 47 y.o.  Admit date: 10/15/2017 Discharge date: 10/17/2017   Discharge Diagnoses:  Active Problems:   Acute diverticulitis   Procedures: None  Hospital Course: This a patient admitted to the hospital with the diagnosis of acute diverticulitis.  He is never had a colonoscopy.  He was treated conservatively with IV antibiotics and has been transitioned now to oral antibiotics on a soft diet with instructions to follow-up in my office next week.  He will require a colonoscopy as an outpatient.  We will arrange GI consultation at that time.  He sent home on Cipro and Flagyl and oral analgesics.  He will resume all home medications and normal activity  Consults: None  Disposition: 01-Home or Self Care    Follow-up Information    Florene Glen, MD. Go on 10/23/2017.   Specialty:  Surgery Why:  Wednesday at 9:15am for hospital follow-up Contact information: [place] [id number] [phone]           Florene Glen, MD, FACS

## 2017-10-17 NOTE — Discharge Instructions (Signed)
Soft diet Resume home medications Take oral analgesics for pain as needed Take Cipro and Flagyl (antibiotics) as directed Follow-up with Dr. Burt Knack at the end of next week Resume normal activities as able

## 2017-10-23 ENCOUNTER — Ambulatory Visit (INDEPENDENT_AMBULATORY_CARE_PROVIDER_SITE_OTHER): Payer: PRIVATE HEALTH INSURANCE | Admitting: Surgery

## 2017-10-23 ENCOUNTER — Encounter: Payer: Self-pay | Admitting: Surgery

## 2017-10-23 VITALS — BP 132/83 | HR 75 | Temp 98.3°F | Ht 73.0 in | Wt 232.0 lb

## 2017-10-23 DIAGNOSIS — K5792 Diverticulitis of intestine, part unspecified, without perforation or abscess without bleeding: Secondary | ICD-10-CM | POA: Diagnosis not present

## 2017-10-23 NOTE — Patient Instructions (Addendum)
Please give [place] a call in case you start having any abdominal pain so we could start you on antibiotics.  We will send a referral for you to see Dr. Allen Norris (gastroenterologist) so he could do your colonoscopy.   Diverticulitis Diverticulitis is when small pockets in your large intestine (colon) get infected or swollen. This causes stomach pain and watery poop (diarrhea). These pouches are called diverticula. They form in people who have a condition called diverticulosis. Follow these instructions at home: Medicines  Take over-the-counter and prescription medicines only as told by your doctor. These include: ? Antibiotics. ? Pain medicines. ? Fiber pills. ? Probiotics. ? Stool softeners.  Do not drive or use heavy machinery while taking prescription pain medicine.  If you were prescribed an antibiotic, take it as told. Do not stop taking it even if you feel better. General instructions  Follow a diet as told by your doctor.  When you feel better, your doctor may tell you to change your diet. You may need to eat a lot of fiber. Fiber makes it easier to poop (have bowel movements). Healthy foods with fiber include: ? Berries. ? Beans. ? Lentils. ? Green vegetables.  Exercise 3 or more times a week. Aim for 30 minutes each time. Exercise enough to sweat and make your heart beat faster.  Keep all follow-up visits as told. This is important. You may need to have an exam of the large intestine. This is called a colonoscopy. Contact a doctor if:  Your pain does not get better.  You have a hard time eating or drinking.  You are not pooping like normal. Get help right away if:  Your pain gets worse.  Your problems do not get better.  Your problems get worse very fast.  You have a fever.  You throw up (vomit) more than one time.  You have poop that is: ? Bloody. ? Black. ? Tarry. Summary  Diverticulitis is when small pockets in your large intestine (colon) get infected or  swollen.  Take medicines only as told by your doctor.  Follow a diet as told by your doctor. This information is not intended to replace advice given to you by your health care provider. Make sure you discuss any questions you have with your health care provider. Document Released: 01/30/2008 Document Revised: 08/30/2016 Document Reviewed: 08/30/2016 Elsevier Interactive Patient Education  2017 [employer].

## 2017-10-23 NOTE — Progress Notes (Signed)
Outpatient Surgical Follow Up  10/23/2017  Cory Johnston is an 47 y.o. male.   CC: Acute diverticulitis  HPI: Patient was recently hospitalized for acute diverticulitis.  He was treated with IV Zosyn then transition to Cipro Flagyl.  I was called over the weekend and he was having nausea and taste problems with the metronidazole and that was stopped as he was doing well. Patient describes diarrhea or at least loose stools but they have normalized over the last day or so.  He has not had any fevers or chills and no nausea or vomiting since stopping the metronidazole.  His nausea was occurring right after taking the dose.  He is not penicillin allergic so Augmentin could be used in the future if need be. Patient is never had a colonoscopy.  He does have a family history of diverticulitis but no family history of colon cancer  Past Medical History:  Diagnosis Date  . Acute diverticulitis 10/15/2017  . Anxiety   . Complication of anesthesia   . Elevated ferritin 08/04/2015  . Gout    hx of  . Hallux valgus of left foot   . Hyperlipemia, mixed   . Hypertension    controlled on meds  . PONV (postoperative nausea and vomiting)   . Sleep apnea    cpap    Past Surgical History:  Procedure Laterality Date  . Barbie Banner OSTEOTOMY Left 02/01/2016   Procedure: Barbie Banner OSTEOTOMY AUSTIN LEFT 1ST METATARSAL;  Surgeon: Samara Deist, DPM;  Location: [facility];  Service: Podiatry;  Laterality: Left;  WITH POPLITEAL CPAP  . ANKLE FRACTURE SURGERY Left   . TONSILLECTOMY AND ADENOIDECTOMY    . TRIGGER FINGER RELEASE    . VASECTOMY      Family History  Problem Relation Age of Onset  . Hypertension Father   . Stroke Father   . CAD Father   . Hyperlipidemia Father   . Diabetes Father   . Stroke Paternal Aunt     Social History:  reports that  has never smoked. he has never used smokeless tobacco. He reports that he drinks about 8.4 oz of alcohol per week. He reports that he does not use  drugs.  Allergies: No Known Allergies  Medications reviewed.   Review of Systems:   Review of Systems  Constitutional: Negative for chills and fever.  HENT: Negative.   Eyes: Negative.   Respiratory: Negative.   Cardiovascular: Negative.   Gastrointestinal: Positive for abdominal pain, diarrhea and nausea. Negative for blood in stool, constipation, heartburn and vomiting.  Genitourinary: Negative.   Musculoskeletal: Negative.   Skin: Negative.   Neurological: Negative.   Endo/Heme/Allergies: Negative.   Psychiatric/Behavioral: Negative.      Physical Exam:  There were no vitals taken for this visit.  Physical Exam  Constitutional: He is oriented to person, place, and time and well-developed, well-nourished, and in no distress. No distress.  HENT:  Head: Normocephalic and atraumatic.  Eyes: Pupils are equal, round, and reactive to light. Right eye exhibits no discharge. Left eye exhibits no discharge. No scleral icterus.  Neck: Normal range of motion.  Cardiovascular: Normal rate, regular rhythm and normal heart sounds.  Pulmonary/Chest: Effort normal and breath sounds normal. No respiratory distress. He has no wheezes. He has no rales.  Abdominal: Soft. He exhibits no distension. There is no tenderness. There is no rebound and no guarding.  Abdominal exam is benign and completely nontender at this point  Musculoskeletal: Normal range of motion. He exhibits  no edema or tenderness.  Lymphadenopathy:    He has no cervical adenopathy.  Neurological: He is alert and oriented to person, place, and time.  Skin: Skin is warm and dry. No rash noted. He is not diaphoretic. No erythema.  Psychiatric: Mood and affect normal.  Vitals reviewed.     No results found for this or any previous visit (from the past 48 hour(s)). No results found.  Assessment/Plan:  This patient with acute diverticulitis.  He is finishing his antibiotics.  His metronidazole was stopped because of  nausea clearly dose related.  I recommended the utilization of either Cipro Flagyl in the future or Augmentin if he would require this in the future.  I reminded him to be diligent of notifying us should he worsen or recur at any time so that he could be placed on oral antibiotics and avoid admission to the hospital.  I discussed the need for colonoscopy in the future should planning of sigmoid colon resection be entertained at any point.  I offered consultation with a colorectal surgeon which can be offered again in the future should he request.  Florene Glen, MD, FACS

## 2017-10-24 ENCOUNTER — Inpatient Hospital Stay: Payer: Self-pay | Admitting: Surgery

## 2017-12-04 ENCOUNTER — Encounter: Payer: Self-pay | Admitting: *Deleted

## 2017-12-08 DIAGNOSIS — Z8719 Personal history of other diseases of the digestive system: Secondary | ICD-10-CM | POA: Insufficient documentation

## 2017-12-19 ENCOUNTER — Encounter: Payer: Self-pay | Admitting: Gastroenterology

## 2017-12-19 ENCOUNTER — Ambulatory Visit (INDEPENDENT_AMBULATORY_CARE_PROVIDER_SITE_OTHER): Payer: PRIVATE HEALTH INSURANCE | Admitting: Gastroenterology

## 2017-12-19 ENCOUNTER — Ambulatory Visit: Payer: PRIVATE HEALTH INSURANCE | Admitting: Gastroenterology

## 2017-12-19 ENCOUNTER — Encounter (INDEPENDENT_AMBULATORY_CARE_PROVIDER_SITE_OTHER): Payer: Self-pay

## 2017-12-19 ENCOUNTER — Other Ambulatory Visit: Payer: Self-pay

## 2017-12-19 VITALS — BP 141/89 | HR 57 | Ht 73.0 in | Wt 216.5 lb

## 2017-12-19 DIAGNOSIS — K5792 Diverticulitis of intestine, part unspecified, without perforation or abscess without bleeding: Secondary | ICD-10-CM

## 2017-12-19 NOTE — Progress Notes (Signed)
Gastroenterology Consultation  Referring Provider:     Adin Hector, MD Primary Care Physician:  Adin Hector, MD Primary Gastroenterologist:  Dr. Allen Norris     Reason for Consultation:     Diverticulitis        HPI:   Cory Johnston is a 47 y.o. y/o male referred for consultation & management of diverticulitis by [name], Wendelyn Breslow III, MD.  This patient comes today after having a bout of diverticulitis.  The patient was in the hospital back in February and was discharged late February for acute diverticulitis.  The inflammatory reaction from his diverticulitis caused him to have impingement on his bladder.  The patient states that he is nowhere near as much pain as he was when he was in the hospital but still continues to have left-sided abdominal pain.  There is no report of any diarrhea or constipation.  Agent also reports that he has a family history of diverticulosis the patient denies any bright red blood per rectum.  He does have a history of back pain and takes NSAIDs when needed.  He also reports that since having the diverticulitis he has had some pain in the left side of his back and his left testicle.  The patient is concerned because he states that he has been recommended to consider surgery for that area of his colon to remove the diverticulosis.  The patient is also concerned because his pain has not completely gone away.  There is no report of any fevers or chills since discharge or nausea vomiting.  The CT scan of the abdomen when the patient was in the hospital showed circumferential thickening of the sigmoid colon with involvement of the mesentery and diffuse inflammatory reaction around the sigmoid colon.  Past Medical History:  Diagnosis Date  . Acute diverticulitis 10/15/2017  . Anxiety   . Complication of anesthesia   . Elevated ferritin 08/04/2015  . Gout    hx of  . Hallux valgus of left foot   . Hyperlipemia, mixed   . Hypertension    controlled on meds  . PONV  (postoperative nausea and vomiting)   . Sleep apnea    cpap    Past Surgical History:  Procedure Laterality Date  . Barbie Banner OSTEOTOMY Left 02/01/2016   Procedure: Barbie Banner OSTEOTOMY AUSTIN LEFT 1ST METATARSAL;  Surgeon: Samara Deist, DPM;  Location: [facility];  Service: Podiatry;  Laterality: Left;  WITH POPLITEAL CPAP  . ANKLE FRACTURE SURGERY Left   . TONSILLECTOMY AND ADENOIDECTOMY    . TRIGGER FINGER RELEASE    . VASECTOMY      Prior to Admission medications   Medication Sig Start Date End Date Taking? Authorizing Provider  ALPRAZolam Duanne Moron) 0.5 MG tablet Take 0.5 mg by mouth at bedtime as needed for anxiety.   Yes [provider]  amLODipine (NORVASC) 5 MG tablet Take 5 mg by mouth daily.    Yes [provider]  aspirin EC 81 MG tablet Take 81 mg by mouth daily.   Yes [provider]  Biotin 1000 MCG tablet Take 1,000 mcg by mouth daily.   Yes [provider]  escitalopram (LEXAPRO) 5 MG tablet Take 2.5 mg by mouth daily. 08/30/17  Yes [provider]  fenofibrate 160 MG tablet Take 160 mg by mouth daily. 07/16/17  Yes [provider]  furosemide (LASIX) 20 MG tablet Take 20 mg by mouth daily as needed. 09/27/17  Yes [provider]  ibuprofen (ADVIL,MOTRIN) 200 MG tablet Take by mouth.   Yes [provider]  Javier Docker Oil 1000 MG CAPS Take 3,000 mg by mouth daily.   Yes [provider]  Multiple Vitamins-Minerals (MULTIVITAMIN MEN) TABS Take 1 tablet by mouth daily.   Yes [provider]  niacin (SLO-NIACIN) 500 MG tablet Take 500 mg by mouth at bedtime.   Yes [provider]  polyethylene glycol (MIRALAX / GLYCOLAX) packet Take 17 g by mouth 2 (two) times daily.   Yes [provider]  psyllium (METAMUCIL) 58.6 % powder Take 1 packet by mouth 2 (two) times daily.   Yes [provider]  testosterone cypionate (DEPOTESTOSTERONE CYPIONATE) 200 MG/ML injection Inject 1 mL  into the muscle every 14 (fourteen) days. 07/16/17  Yes [provider]  tiZANidine (ZANAFLEX) 4 MG tablet Take by mouth. 02/18/17 02/18/18 Yes [provider]  ciprofloxacin (CIPRO) 500 MG tablet Take 1 tablet (500 mg total) by mouth 2 (two) times daily. Patient not taking: Reported on 12/19/2017 10/17/17   Florene Glen, MD  oxyCODONE-acetaminophen [facility]) 7.5-325 MG tablet 1-2 tablets every 4-6 hours prn pain Patient not taking: Reported on 12/19/2017 10/17/17   Florene Glen, MD    Family History  Problem Relation Age of Onset  . Hypertension Father   . Stroke Father   . CAD Father   . Hyperlipidemia Father   . Diabetes Father   . Stroke Paternal Aunt      Social History   Tobacco Use  . Smoking status: Never Smoker  . Smokeless tobacco: Never Used  Substance Use Topics  . Alcohol use: Yes    Alcohol/week: 8.4 oz    Types: 12 Cans of beer, 2 Shots of liquor per week    Comment: 3 x week beer and liquor  . Drug use: No    Allergies as of 12/19/2017  . (No Known Allergies)    Review of Systems:    All systems reviewed and negative except where noted in HPI.   Physical Exam:  BP (!) 141/89   Pulse (!) 57   Ht 6\' 1"  (1.854 m)   Wt 216 lb 8 oz (98.2 kg)   BMI 28.56 kg/m  No LMP for male patient. Psych:  Alert and cooperative. Normal mood and affect. General:   Alert,  Well-developed, well-nourished, pleasant and cooperative in NAD Head:  Normocephalic and atraumatic. Eyes:  Sclera clear, no icterus.   Conjunctiva pink. Ears:  Normal auditory acuity. Nose:  No deformity, discharge, or lesions. Mouth:  No deformity or lesions,oropharynx pink & moist. Neck:  Supple; no masses or thyromegaly. Lungs:  Respirations even and unlabored.  Clear throughout to auscultation.   No wheezes, crackles, or rhonchi. No acute distress. Heart:  Regular rate and rhythm; no murmurs, clicks, rubs, or gallops. Abdomen:  Normal bowel sounds.  No bruits.  Soft,  mild left lower quadrant tenderness without rebound tenderness and non-distended without masses, hepatosplenomegaly or hernias noted.  No guarding.  Negative Carnett sign.   Rectal:  Deferred.  Msk:  Symmetrical without gross deformities.  Good, equal movement & strength bilaterally. Pulses:  Normal pulses noted. Extremities:  No clubbing or edema.  No cyanosis. Neurologic:  Alert and oriented x3;  grossly normal neurologically. Skin:  Intact without significant lesions or rashes.  No jaundice. Lymph Nodes:  No significant cervical adenopathy. Psych:  Alert and cooperative. Normal mood and affect.  Imaging Studies: No results found.  Assessment  and Plan:   Cory Johnston is a 47 y.o. y/o male who comes in after having a bout of diverticulitis back in February.  The patient has finished antibiotics but reports that he still has some left-sided abdominal pain.  The patient has never had a colonoscopy and has been told that he will need a colonoscopy due to his recent bout of diverticulitis.  The patient will be set up for a CT scan of the abdomen and pelvis with contrast because of his continued abdominal pain.  If the CT scan is negative the patient will be set up for colonoscopy.  If the CT scan shows any sign of diverticulitis patient will be restarted on antibiotics.  The patient has been explained the plan and agrees with it.  Lucilla Lame, MD. Marval Regal   Note: This dictation was prepared with Dragon dictation along with smaller phrase technology. Any transcriptional errors that result from this process are unintentional.

## 2017-12-19 NOTE — Patient Instructions (Signed)
CT Scan on 12/23/17 in [place] at 10am, arrival 9:45am. (pick up oral contrast today or tomorrow at radiology). The morning of CT after 6am, may have only water.

## 2017-12-20 ENCOUNTER — Telehealth: Payer: Self-pay | Admitting: Gastroenterology

## 2017-12-20 ENCOUNTER — Other Ambulatory Visit: Payer: Self-pay

## 2017-12-20 NOTE — Telephone Encounter (Signed)
Patients girlfriend called and needs to know what day of the week will this colonoscopy be? She needs to ask off work. Please call today Cory Johnston [phone]

## 2017-12-23 ENCOUNTER — Ambulatory Visit
Admission: RE | Admit: 2017-12-23 | Discharge: 2017-12-23 | Disposition: A | Discharge status: Home / Self Care | Payer: PRIVATE HEALTH INSURANCE | Source: Ambulatory Visit | Attending: Gastroenterology | Admitting: Gastroenterology

## 2017-12-23 ENCOUNTER — Encounter (INDEPENDENT_AMBULATORY_CARE_PROVIDER_SITE_OTHER): Payer: Self-pay

## 2017-12-23 DIAGNOSIS — R911 Solitary pulmonary nodule: Secondary | ICD-10-CM | POA: Diagnosis not present

## 2017-12-23 DIAGNOSIS — K5792 Diverticulitis of intestine, part unspecified, without perforation or abscess without bleeding: Secondary | ICD-10-CM | POA: Diagnosis present

## 2017-12-23 DIAGNOSIS — R162 Hepatomegaly with splenomegaly, not elsewhere classified: Secondary | ICD-10-CM | POA: Diagnosis not present

## 2017-12-23 DIAGNOSIS — K760 Fatty (change of) liver, not elsewhere classified: Secondary | ICD-10-CM | POA: Diagnosis not present

## 2017-12-23 MED ORDER — IOHEXOL 300 MG/ML  SOLN
100.0000 mL | Freq: Once | INTRAMUSCULAR | Status: AC | PRN
  Administered 2017-12-23: 100 mL via INTRAVENOUS

## 2017-12-24 ENCOUNTER — Telehealth: Payer: Self-pay | Admitting: Gastroenterology

## 2017-12-24 ENCOUNTER — Other Ambulatory Visit: Payer: Self-pay

## 2017-12-24 ENCOUNTER — Telehealth: Payer: Self-pay

## 2017-12-24 DIAGNOSIS — Z8719 Personal history of other diseases of the digestive system: Secondary | ICD-10-CM

## 2017-12-24 NOTE — Telephone Encounter (Signed)
Returned [employer] call regarding scheduling colonoscopy. Pt scheduled at [facility] on 01/23/18.

## 2017-12-24 NOTE — Telephone Encounter (Signed)
Cory Johnston CALLED REGARDING PT WOULD LIKE TO DO COLONOSCOPY  05/30 IN THE MORNING PLEASE CALL [phone]  TO CONFIRM

## 2017-12-24 NOTE — Telephone Encounter (Signed)
-----   Message from Lucilla Lame, MD sent at 12/24/2017  7:51 AM EDT ----- Let the patient know that the CT scan did not show any active diverticulitis and he should be set up for colonoscopy.  The patient reported that he wanted to be done sooner than later because of his schedule.

## 2017-12-24 NOTE — Telephone Encounter (Signed)
Pt's girlfriend notified of CT scan results. Pt will discuss dates with her and let me know when he can have the colonoscopy.

## 2017-12-30 ENCOUNTER — Telehealth: Payer: Self-pay | Admitting: Internal Medicine

## 2017-12-30 NOTE — Telephone Encounter (Signed)
Sleep studies faxed over to [facility] on 12/30/17

## 2018-01-22 NOTE — Discharge Instructions (Signed)
General Anesthesia, Adult, Care After °These instructions provide you with information about caring for yourself after your procedure. Your health care provider may also give you more specific instructions. Your treatment has been planned according to current medical practices, but problems sometimes occur. Call your health care provider if you have any problems or questions after your procedure. °What can I expect after the procedure? °After the procedure, it is common to have: °· Vomiting. °· A sore throat. °· Mental slowness. ° °It is common to feel: °· Nauseous. °· Cold or shivery. °· Sleepy. °· Tired. °· Sore or achy, even in parts of your body where you did not have surgery. ° °Follow these instructions at home: °For at least 24 hours after the procedure: °· Do not: °? Participate in activities where you could fall or become injured. °? Drive. °? Use heavy machinery. °? Drink alcohol. °? Take sleeping pills or medicines that cause drowsiness. °? Make important decisions or sign legal documents. °? Take care of children on your own. °· Rest. °Eating and drinking °· If you vomit, drink water, juice, or soup when you can drink without vomiting. °· Drink enough fluid to keep your urine clear or pale yellow. °· Make sure you have little or no nausea before eating solid foods. °· Follow the diet recommended by your health care provider. °General instructions °· Have a responsible adult stay with you until you are awake and alert. °· Return to your normal activities as told by your health care provider. Ask your health care provider what activities are safe for you. °· Take over-the-counter and prescription medicines only as told by your health care provider. °· If you smoke, do not smoke without supervision. °· Keep all follow-up visits as told by your health care provider. This is important. °Contact a health care provider if: °· You continue to have nausea or vomiting at home, and medicines are not helpful. °· You  cannot drink fluids or start eating again. °· You cannot urinate after 8-12 hours. °· You develop a skin rash. °· You have fever. °· You have increasing redness at the site of your procedure. °Get help right away if: °· You have difficulty breathing. °· You have chest pain. °· You have unexpected bleeding. °· You feel that you are having a life-threatening or urgent problem. °This information is not intended to replace advice given to you by your health care provider. Make sure you discuss any questions you have with your health care provider. °Document Released: 11/19/2000 Document Revised: 01/16/2016 Document Reviewed: 07/28/2015 °Elsevier Interactive Patient Education © 2018 Elsevier Inc. ° °

## 2018-01-23 ENCOUNTER — Ambulatory Visit: Payer: PRIVATE HEALTH INSURANCE | Admitting: Anesthesiology

## 2018-01-23 ENCOUNTER — Encounter
Admission: RE | Disposition: A | Discharge status: Home / Self Care | Payer: Self-pay | Source: Ambulatory Visit | Attending: Gastroenterology

## 2018-01-23 ENCOUNTER — Ambulatory Visit
Admission: RE | Admit: 2018-01-23 | Discharge: 2018-01-23 | Disposition: A | Discharge status: Home / Self Care | Payer: PRIVATE HEALTH INSURANCE | Source: Ambulatory Visit | Attending: Gastroenterology | Admitting: Gastroenterology

## 2018-01-23 DIAGNOSIS — K5730 Diverticulosis of large intestine without perforation or abscess without bleeding: Secondary | ICD-10-CM | POA: Diagnosis not present

## 2018-01-23 DIAGNOSIS — Z79899 Other long term (current) drug therapy: Secondary | ICD-10-CM | POA: Diagnosis not present

## 2018-01-23 DIAGNOSIS — E782 Mixed hyperlipidemia: Secondary | ICD-10-CM | POA: Insufficient documentation

## 2018-01-23 DIAGNOSIS — F419 Anxiety disorder, unspecified: Secondary | ICD-10-CM | POA: Insufficient documentation

## 2018-01-23 DIAGNOSIS — G4730 Sleep apnea, unspecified: Secondary | ICD-10-CM | POA: Diagnosis not present

## 2018-01-23 DIAGNOSIS — D125 Benign neoplasm of sigmoid colon: Secondary | ICD-10-CM | POA: Diagnosis not present

## 2018-01-23 DIAGNOSIS — K635 Polyp of colon: Secondary | ICD-10-CM | POA: Diagnosis not present

## 2018-01-23 DIAGNOSIS — Z09 Encounter for follow-up examination after completed treatment for conditions other than malignant neoplasm: Secondary | ICD-10-CM | POA: Diagnosis present

## 2018-01-23 DIAGNOSIS — K5732 Diverticulitis of large intestine without perforation or abscess without bleeding: Secondary | ICD-10-CM

## 2018-01-23 DIAGNOSIS — D123 Benign neoplasm of transverse colon: Secondary | ICD-10-CM | POA: Diagnosis not present

## 2018-01-23 DIAGNOSIS — I10 Essential (primary) hypertension: Secondary | ICD-10-CM | POA: Insufficient documentation

## 2018-01-23 DIAGNOSIS — Z7982 Long term (current) use of aspirin: Secondary | ICD-10-CM | POA: Insufficient documentation

## 2018-01-23 DIAGNOSIS — Z8719 Personal history of other diseases of the digestive system: Secondary | ICD-10-CM

## 2018-01-23 HISTORY — PX: POLYPECTOMY: SHX5525

## 2018-01-23 HISTORY — DX: Calculus of kidney: N20.0

## 2018-01-23 HISTORY — DX: Fatty (change of) liver, not elsewhere classified: K76.0

## 2018-01-23 HISTORY — PX: COLONOSCOPY WITH PROPOFOL: SHX5780

## 2018-01-23 SURGERY — COLONOSCOPY WITH PROPOFOL
Anesthesia: General

## 2018-01-23 MED ORDER — ACETAMINOPHEN 160 MG/5ML PO SOLN: 325.0000 mg | ORAL | Status: DC | PRN

## 2018-01-23 MED ORDER — STERILE WATER FOR IRRIGATION IR SOLN
Status: DC | PRN
  Administered 2018-01-23 (×2)

## 2018-01-23 MED ORDER — PROPOFOL 10 MG/ML IV BOLUS
INTRAVENOUS | Status: DC | PRN
Start: 2018-01-23 — End: 2018-01-23
  Administered 2018-01-23: 40 mg via INTRAVENOUS
  Administered 2018-01-23: 100 mg via INTRAVENOUS
  Administered 2018-01-23 (×3): 40 mg via INTRAVENOUS
  Administered 2018-01-23: 20 mg via INTRAVENOUS
  Administered 2018-01-23: 100 mg via INTRAVENOUS

## 2018-01-23 MED ORDER — ONDANSETRON HCL 4 MG/2ML IJ SOLN: 4.0000 mg | Freq: Once | INTRAMUSCULAR | Status: DC | PRN

## 2018-01-23 MED ORDER — SODIUM CHLORIDE 0.9 % IV SOLN: INTRAVENOUS | Status: DC

## 2018-01-23 MED ORDER — LIDOCAINE HCL (CARDIAC) PF 100 MG/5ML IV SOSY
PREFILLED_SYRINGE | INTRAVENOUS | Status: DC | PRN
  Administered 2018-01-23: 20 mg via INTRAVENOUS

## 2018-01-23 MED ORDER — LACTATED RINGERS IV SOLN
INTRAVENOUS | Status: DC
  Administered 2018-01-23: 11:00:00 via INTRAVENOUS

## 2018-01-23 MED ORDER — ACETAMINOPHEN 325 MG PO TABS: 650.0000 mg | ORAL_TABLET | Freq: Once | ORAL | Status: DC | PRN

## 2018-01-23 SURGICAL SUPPLY — 24 items
CANISTER SUCT 1200ML W/VALVE (MISCELLANEOUS) ×3 IMPLANT
CLIP HMST 235XBRD CATH ROT (MISCELLANEOUS) IMPLANT
CLIP RESOLUTION 360 11X235 (MISCELLANEOUS)
ELECT REM PT RETURN 9FT ADLT (ELECTROSURGICAL)
ELECTRODE REM PT RTRN 9FT ADLT (ELECTROSURGICAL) IMPLANT
FCP ESCP3.2XJMB 240X2.8X (MISCELLANEOUS)
FORCEPS BIOP RAD 4 LRG CAP 4 (CUTTING FORCEPS) ×3 IMPLANT
FORCEPS BIOP RJ4 240 W/NDL (MISCELLANEOUS)
FORCEPS ESCP3.2XJMB 240X2.8X (MISCELLANEOUS) IMPLANT
GOWN CVR UNV OPN BCK APRN NK (MISCELLANEOUS) ×2 IMPLANT
GOWN ISOL THUMB LOOP REG UNIV (MISCELLANEOUS) ×4
INJECTOR VARIJECT VIN23 (MISCELLANEOUS) IMPLANT
KIT DEFENDO VALVE AND CONN (KITS) IMPLANT
KIT ENDO PROCEDURE OLY (KITS) ×3 IMPLANT
MARKER SPOT ENDO TATTOO 5ML (MISCELLANEOUS) IMPLANT
PROBE APC STR FIRE (PROBE) IMPLANT
RETRIEVER NET ROTH 2.5X230 LF (MISCELLANEOUS) IMPLANT
SNARE SHORT THROW 13M SML OVAL (MISCELLANEOUS) IMPLANT
SNARE SHORT THROW 30M LRG OVAL (MISCELLANEOUS) IMPLANT
SNARE SNG USE RND 15MM (INSTRUMENTS) IMPLANT
SPOT EX ENDOSCOPIC TATTOO (MISCELLANEOUS)
TRAP ETRAP POLY (MISCELLANEOUS) IMPLANT
VARIJECT INJECTOR VIN23 (MISCELLANEOUS)
WATER STERILE IRR 250ML POUR (IV SOLUTION) ×3 IMPLANT

## 2018-01-23 NOTE — H&P (Signed)
Lucilla Lame, MD [address] [place], [place] [address] [phone] Fax : [phone]  Primary Care Physician:  Adin Hector, MD Primary Gastroenterologist:  Dr. Allen Norris  Pre-Procedure History & Physical: HPI:  Cory Johnston is a 47 y.o. male is here for an colonoscopy.   Past Medical History:  Diagnosis Date  . Acute diverticulitis 10/15/2017  . Anxiety   . Complication of anesthesia   . Elevated ferritin 08/04/2015  . Fatty liver   . Gout    hx of  . Hallux valgus of left foot   . Hyperlipemia, mixed   . Hypertension    controlled on meds  . Nephrolithiasis   . PONV (postoperative nausea and vomiting)   . Sleep apnea    cpap    Past Surgical History:  Procedure Laterality Date  . Barbie Banner OSTEOTOMY Left 02/01/2016   Procedure: Barbie Banner OSTEOTOMY AUSTIN LEFT 1ST METATARSAL;  Surgeon: Samara Deist, DPM;  Location: [facility];  Service: Podiatry;  Laterality: Left;  WITH POPLITEAL CPAP  . ANKLE FRACTURE SURGERY Left   . TONSILLECTOMY AND ADENOIDECTOMY    . TRIGGER FINGER RELEASE    . VASECTOMY      Prior to Admission medications   Medication Sig Start Date End Date Taking? Authorizing Provider  ALPRAZolam Duanne Moron) 0.5 MG tablet Take 0.5 mg by mouth at bedtime as needed for anxiety.   Yes [provider]  amLODipine (NORVASC) 5 MG tablet Take 5 mg by mouth daily.    Yes [provider]  fenofibrate 160 MG tablet Take 160 mg by mouth daily. 07/16/17  Yes [provider]  ibuprofen (ADVIL,MOTRIN) 200 MG tablet Take by mouth.   Yes [provider]  Javier Docker Oil 1000 MG CAPS Take 3,000 mg by mouth daily.   Yes [provider]  Multiple Vitamins-Minerals (MULTIVITAMIN MEN) TABS Take 1 tablet by mouth daily.   Yes [provider]  niacin (SLO-NIACIN) 500 MG tablet Take 500 mg by mouth at bedtime.   Yes [provider]  psyllium (METAMUCIL) 58.6 % powder Take 1 packet by mouth 2 (two)  times daily.   Yes [provider]  aspirin EC 81 MG tablet Take 81 mg by mouth daily.    [provider]  Biotin 1000 MCG tablet Take 1,000 mcg by mouth daily.    [provider]  ciprofloxacin (CIPRO) 500 MG tablet Take 1 tablet (500 mg total) by mouth 2 (two) times daily. Patient not taking: Reported on 12/19/2017 10/17/17   Florene Glen, MD  escitalopram (LEXAPRO) 5 MG tablet Take 2.5 mg by mouth daily. 08/30/17   [provider]  furosemide (LASIX) 20 MG tablet Take 20 mg by mouth daily as needed. 09/27/17   [provider]  omega-3 acid ethyl esters (LOVAZA) 1 g capsule Take by mouth. 12/09/17 12/09/18  [provider]  oxyCODONE-acetaminophen (PERCOCET) 7.5-325 MG tablet 1-2 tablets every 4-6 hours prn pain Patient not taking: Reported on 12/19/2017 10/17/17   Florene Glen, MD  polyethylene glycol [facility] Floria Raveling) packet Take 17 g by mouth 2 (two) times daily.    [provider]  testosterone cypionate (DEPOTESTOSTERONE CYPIONATE) 200 MG/ML injection Inject 1 mL into the muscle every 14 (fourteen) days. 07/16/17   [provider]  tiZANidine (ZANAFLEX) 4 MG tablet Take by mouth. 02/18/17 02/18/18  [provider]    Allergies as of 12/24/2017  . (No Known Allergies)    Family  History  Problem Relation Age of Onset  . Hypertension Father   . Stroke Father   . CAD Father   . Hyperlipidemia Father   . Diabetes Father   . Stroke Paternal Aunt     Social History   Socioeconomic History  . Marital status: Divorced    Spouse name: Not on file  . Number of children: Not on file  . Years of education: Not on file  . Highest education level: Not on file  Occupational History  . Not on file  Social Needs  . Financial resource strain: Not on file  . Food insecurity:    Worry: Not on file    Inability: Not on file  . Transportation needs:    Medical: Not on file    Non-medical: Not on file    Tobacco Use  . Smoking status: Never Smoker  . Smokeless tobacco: Never Used  Substance and Sexual Activity  . Alcohol use: Yes    Alcohol/week: 8.4 oz    Types: 12 Cans of beer, 2 Shots of liquor per week    Comment: 3 x week beer and liquor  . Drug use: No  . Sexual activity: Not on file  Lifestyle  . Physical activity:    Days per week: Not on file    Minutes per session: Not on file  . Stress: Not on file  Relationships  . Social connections:    Talks on phone: Not on file    Gets together: Not on file    Attends religious service: Not on file    Active member of club or organization: Not on file    Attends meetings of clubs or organizations: Not on file    Relationship status: Not on file  . Intimate partner violence:    Fear of current or ex partner: Not on file    Emotionally abused: Not on file    Physically abused: Not on file    Forced sexual activity: Not on file  Other Topics Concern  . Not on file  Social History Narrative  . Not on file    Review of Systems: See HPI, otherwise negative ROS  Physical Exam: BP (!) 141/75   Pulse 69   Temp 98.1 F (36.7 C) (Temporal)   Resp 16   Ht 6' (1.829 m)   Wt 229 lb (103.9 kg)   SpO2 100%   BMI 31.06 kg/m  General:   Alert,  pleasant and cooperative in NAD Head:  Normocephalic and atraumatic. Neck:  Supple; no masses or thyromegaly. Lungs:  Clear throughout to auscultation.    Heart:  Regular rate and rhythm. Abdomen:  Soft, nontender and nondistended. Normal bowel sounds, without guarding, and without rebound.   Neurologic:  Alert and  oriented x4;  grossly normal neurologically.  Impression/Plan: Cory Johnston is here for an colonoscopy to be performed for diverticulitis  Risks, benefits, limitations, and alternatives regarding  colonoscopy have been reviewed with the patient.  Questions have been answered.  All parties agreeable.   Lucilla Lame, MD  01/23/2018, 10:56 AM

## 2018-01-23 NOTE — Anesthesia Postprocedure Evaluation (Signed)
Anesthesia Post Note  Patient: Cory Johnston  Procedure(s) Performed: COLONOSCOPY WITH PROPOFOL (N/A ) POLYPECTOMY (N/A )  Patient location during evaluation: PACU Anesthesia Type: General Level of consciousness: awake and alert, oriented and patient cooperative Pain management: pain level controlled Vital Signs Assessment: post-procedure vital signs reviewed and stable Respiratory status: spontaneous breathing, nonlabored ventilation and respiratory function stable Cardiovascular status: blood pressure returned to baseline and stable Postop Assessment: adequate PO intake Anesthetic complications: no    Darrin Nipper

## 2018-01-23 NOTE — Anesthesia Preprocedure Evaluation (Signed)
Anesthesia Evaluation  Patient identified by MRN, date of birth, ID band Patient awake    Reviewed: Allergy & Precautions, NPO status , Patient's Chart, lab work & pertinent test results  History of Anesthesia Complications (+) PONV and history of anesthetic complications  Airway Mallampati: IV  TM Distance: >3 FB Neck ROM: Full    Dental no notable dental hx.    Pulmonary sleep apnea ,    Pulmonary exam normal breath sounds clear to auscultation       Cardiovascular Exercise Tolerance: Good hypertension, Normal cardiovascular exam Rhythm:Regular Rate:Normal     Neuro/Psych negative neurological ROS     GI/Hepatic negative GI ROS, Fatty liver   Endo/Other  negative endocrine ROS  Renal/GU Renal disease (nephrolithiasis)     Musculoskeletal   Abdominal   Peds  Hematology negative hematology ROS (+)   Anesthesia Other Findings   Reproductive/Obstetrics                             Anesthesia Physical Anesthesia Plan  ASA: II  Anesthesia Plan: General   Post-op Pain Management:    Induction: Intravenous  PONV Risk Score and Plan: 3 and TIVA and Propofol infusion  Airway Management Planned: Natural Airway  Additional Equipment:   Intra-op Plan:   Post-operative Plan:   Informed Consent: I have reviewed the patients History and Physical, chart, labs and discussed the procedure including the risks, benefits and alternatives for the proposed anesthesia with the patient or authorized representative who has indicated his/her understanding and acceptance.     Plan Discussed with: CRNA  Anesthesia Plan Comments:         Anesthesia Quick Evaluation

## 2018-01-23 NOTE — Transfer of Care (Signed)
Immediate Anesthesia Transfer of Care Note  Patient: Cory Johnston  Procedure(s) Performed: COLONOSCOPY WITH PROPOFOL (N/A ) POLYPECTOMY (N/A )  Patient Location: PACU  Anesthesia Type: General  Level of Consciousness: awake, alert  and patient cooperative  Airway and Oxygen Therapy: Patient Spontanous Breathing and Patient connected to supplemental oxygen  Post-op Assessment: Post-op Vital signs reviewed, Patient's Cardiovascular Status Stable, Respiratory Function Stable, Patent Airway and No signs of Nausea or vomiting  Post-op Vital Signs: Reviewed and stable  Complications: No apparent anesthesia complications

## 2018-01-23 NOTE — Op Note (Signed)
[facility] Patient Name: Cory Johnston Procedure Date: 01/23/2018 10:57 AM MRN: [id number] Account #: [id number] Date of Birth: June 24, 1971 Admit Type: Outpatient Age: 47 Room: [facility] OR ROOM 01 Gender: Male Note Status: Finalized Procedure:            Colonoscopy Indications:          Follow-up of diverticulitis Providers:            Lucilla Lame MD, MD Referring MD:         Ramonita Lab, MD (Referring MD) Medicines:            Propofol per Anesthesia Complications:        No immediate complications. Procedure:            Pre-Anesthesia Assessment:                       - Prior to the procedure, a History and Physical was                        performed, and patient medications and allergies were                        reviewed. The patient's tolerance of previous                        anesthesia was also reviewed. The risks and benefits of                        the procedure and the sedation options and risks were                        discussed with the patient. All questions were                        answered, and informed consent was obtained. Prior                        Anticoagulants: The patient has taken no previous                        anticoagulant or antiplatelet agents. ASA Grade                        Assessment: II - A patient with mild systemic disease.                        After reviewing the risks and benefits, the patient was                        deemed in satisfactory condition to undergo the                        procedure.                       After obtaining informed consent, the colonoscope was                        passed under direct vision. Throughout the procedure,  the patient's blood pressure, pulse, and oxygen                        saturations were monitored continuously. The Olympus CF                        H180AL Colonoscope (S#: [id number]) was introduced through     the anus and advanced to the the cecum, identified by                        appendiceal orifice and ileocecal valve. The                        colonoscopy was performed without difficulty. The                        patient tolerated the procedure well. The quality of                        the bowel preparation was excellent. Findings:      The perianal and digital rectal examinations were normal.      Two sessile polyps were found in the sigmoid colon. The polyps were 2 to       3 mm in size. These polyps were removed with a cold biopsy forceps.       Resection and retrieval were complete.      A 3 mm polyp was found in the transverse colon. The polyp was sessile.       The polyp was removed with a cold biopsy forceps. Resection and       retrieval were complete.      A few small-mouthed diverticula were found in the sigmoid colon and       transverse colon. Impression:           - Two 2 to 3 mm polyps in the sigmoid colon, removed                        with a cold biopsy forceps. Resected and retrieved.                       - One 3 mm polyp in the transverse colon, removed with                        a cold biopsy forceps. Resected and retrieved.                       - Diverticulosis in the sigmoid colon and in the                        transverse colon. Recommendation:       - Discharge patient to home.                       - Resume previous diet.                       - Continue present medications.                       - Await pathology results.                       -  Repeat colonoscopy in 5 years if polyp adenoma and 10                        years if hyperplastic Procedure Code(s):    --- Professional ---                       [phone], Colonoscopy, flexible; with biopsy, single or                        multiple Diagnosis Code(s):    --- Professional ---                       K57.32, Diverticulitis of large intestine without                        perforation or  abscess without bleeding                       D12.5, Benign neoplasm of sigmoid colon                       D12.3, Benign neoplasm of transverse colon (hepatic                        flexure or splenic flexure) CPT copyright 2017 American Medical Association. All rights reserved. The codes documented in this report are preliminary and upon coder review may  be revised to meet current compliance requirements. Lucilla Lame MD, MD 01/23/2018 11:24:20 AM This report has been signed electronically. Number of Addenda: 0 Note Initiated On: 01/23/2018 10:57 AM Scope Withdrawal Time: 0 hours 10 minutes 48 seconds  Total Procedure Duration: 0 hours 13 minutes 30 seconds       [facility]

## 2018-01-24 ENCOUNTER — Encounter: Payer: Self-pay | Admitting: Gastroenterology

## 2018-01-27 ENCOUNTER — Encounter: Payer: Self-pay | Admitting: Gastroenterology

## 2018-01-27 LAB — SURGICAL PATHOLOGY

## 2018-02-13 ENCOUNTER — Telehealth: Payer: Self-pay

## 2018-02-13 NOTE — Telephone Encounter (Signed)
Called patient but was not able to get in contact with him nor leave him a message since his mailbox is full. I will send patient a letter to get in contact with [place] at his earliest convenience.

## 2018-10-08 ENCOUNTER — Telehealth: Payer: Self-pay | Admitting: Family Medicine

## 2018-10-08 ENCOUNTER — Ambulatory Visit
Admission: EM | Admit: 2018-10-08 | Discharge: 2018-10-08 | Disposition: A | Discharge status: Home / Self Care | Payer: PRIVATE HEALTH INSURANCE | Attending: Family Medicine | Admitting: Family Medicine

## 2018-10-08 ENCOUNTER — Ambulatory Visit (INDEPENDENT_AMBULATORY_CARE_PROVIDER_SITE_OTHER): Payer: PRIVATE HEALTH INSURANCE

## 2018-10-08 ENCOUNTER — Encounter: Payer: Self-pay | Admitting: Emergency Medicine

## 2018-10-08 ENCOUNTER — Other Ambulatory Visit: Payer: Self-pay

## 2018-10-08 DIAGNOSIS — R69 Illness, unspecified: Principal | ICD-10-CM

## 2018-10-08 DIAGNOSIS — I10 Essential (primary) hypertension: Secondary | ICD-10-CM | POA: Diagnosis not present

## 2018-10-08 DIAGNOSIS — R0981 Nasal congestion: Secondary | ICD-10-CM

## 2018-10-08 DIAGNOSIS — R05 Cough: Secondary | ICD-10-CM

## 2018-10-08 DIAGNOSIS — J111 Influenza due to unidentified influenza virus with other respiratory manifestations: Secondary | ICD-10-CM

## 2018-10-08 DIAGNOSIS — R509 Fever, unspecified: Secondary | ICD-10-CM | POA: Diagnosis not present

## 2018-10-08 LAB — RAPID INFLUENZA A&B ANTIGENS
Influenza A (ARMC): NEGATIVE
Influenza B (ARMC): NEGATIVE

## 2018-10-08 MED ORDER — BALOXAVIR MARBOXIL(80 MG DOSE) 2 X 40 MG PO TBPK: 80.0000 mg | ORAL_TABLET | Freq: Once | ORAL | 0 refills | Status: AC

## 2018-10-08 MED ORDER — ONDANSETRON HCL 4 MG PO TABS: 4.0000 mg | ORAL_TABLET | Freq: Three times a day (TID) | ORAL | 0 refills | Status: DC | PRN

## 2018-10-08 MED ORDER — OSELTAMIVIR PHOSPHATE 75 MG PO CAPS: 75.0000 mg | ORAL_CAPSULE | Freq: Two times a day (BID) | ORAL | 0 refills | Status: DC

## 2018-10-08 MED ORDER — ONDANSETRON 8 MG PO TBDP
8.0000 mg | ORAL_TABLET | Freq: Once | ORAL | Status: AC
  Administered 2018-10-08: 8 mg via ORAL

## 2018-10-08 NOTE — ED Provider Notes (Signed)
[facility]    CSN: [id number] Arrival date & time: 10/08/18  1152  History   Chief Complaint Chief Complaint  Patient presents with  . Cough  . Fever   HPI  48 year old male presents respiratory symptoms and fever.  Patient reports that he has been sick since this past Wednesday.  Reports that he has had cough and congestion.  Yesterday developed fever and body aches.  Continues to have cough and congestion as well as nausea.  Has had some vomiting.  Body aches are severe.  No known exacerbating relieving factors.  Patient feels very poorly.  Concern for influenza.  No other associated symptoms.  PMH, Surgical Hx, Family Hx, Social History reviewed and updated as below.  Past Medical History:  Diagnosis Date  . Acute diverticulitis 10/15/2017  . Anxiety   . Complication of anesthesia   . Elevated ferritin 08/04/2015  . Fatty liver   . Gout    hx of  . Hallux valgus of left foot   . Hyperlipemia, mixed   . Hypertension    controlled on meds  . Nephrolithiasis   . PONV (postoperative nausea and vomiting)   . Sleep apnea    cpap   Patient Active Problem List   Diagnosis Date Noted  . Diverticulitis of large intestine without perforation or abscess without bleeding   . Polyp of sigmoid colon   . Benign neoplasm of transverse colon   . History of diverticulitis 12/08/2017  . Hypercholesterolemia 10/17/2017  . Hypertension 10/17/2017  . Hypogonadism in male 10/17/2017  . Acute diverticulitis 10/15/2017  . Other specified anxiety disorders 02/18/2017  . Elevated liver enzymes 10/17/2016  . Obstructive sleep apnea 08/29/2016  . Elevated ferritin 08/04/2015   Past Surgical History:  Procedure Laterality Date  . Barbie Banner OSTEOTOMY Left 02/01/2016   Procedure: Barbie Banner OSTEOTOMY [place] LEFT 1ST METATARSAL;  Surgeon: Samara Deist, DPM;  Location: [place];  Service: Podiatry;  Laterality: Left;  WITH POPLITEAL CPAP  . ANKLE FRACTURE SURGERY Left   .  COLONOSCOPY WITH PROPOFOL N/A 01/23/2018   Procedure: COLONOSCOPY WITH PROPOFOL;  Surgeon: Lucilla Lame, MD;  Location: [place];  Service: Endoscopy;  Laterality: N/A;  . POLYPECTOMY N/A 01/23/2018   Procedure: POLYPECTOMY;  Surgeon: Lucilla Lame, MD;  Location: [place];  Service: Endoscopy;  Laterality: N/A;  . TONSILLECTOMY AND ADENOIDECTOMY    . TRIGGER FINGER RELEASE    . VASECTOMY     Home Medications    Prior to Admission medications   Medication Sig Start Date End Date Taking? Authorizing Provider  ALPRAZolam Duanne Moron) 0.5 MG tablet Take 0.5 mg by mouth at bedtime as needed for anxiety.   Yes [provider]  amLODipine (NORVASC) 5 MG tablet Take 5 mg by mouth daily.    Yes [provider]  aspirin EC 81 MG tablet Take 81 mg by mouth daily.   Yes [provider]  Biotin 1000 MCG tablet Take 1,000 mcg by mouth daily.   Yes [provider]  escitalopram (LEXAPRO) 5 MG tablet Take 2.5 mg by mouth daily. 08/30/17  Yes [provider]  fenofibrate 160 MG tablet Take 160 mg by mouth daily. 07/16/17  Yes [provider]  ibuprofen (ADVIL,MOTRIN) 200 MG tablet Take by mouth.   Yes [provider]  Javier Docker Oil 1000 MG CAPS Take 3,000 mg by mouth daily.   Yes [provider]  Multiple Vitamins-Minerals (MULTIVITAMIN MEN) TABS Take 1 tablet by  mouth daily.   Yes [provider]  niacin (SLO-NIACIN) 500 MG tablet Take 500 mg by mouth at bedtime.   Yes [provider]  omega-3 acid ethyl esters (LOVAZA) 1 g capsule Take by mouth. 12/09/17 12/09/18 Yes [provider]  polyethylene glycol (MIRALAX / GLYCOLAX) packet Take 17 g by mouth 2 (two) times daily.   Yes [provider]  psyllium (METAMUCIL) 58.6 % powder Take 1 packet by mouth 2 (two) times daily.   Yes [provider]  testosterone cypionate (DEPOTESTOSTERONE CYPIONATE) 200 MG/ML injection Inject 1 mL into the  muscle every 14 (fourteen) days. 07/16/17  Yes [provider]  Baloxavir Marboxil,80 MG Dose, (XOFLUZA) 2 x 40 MG TBPK Take 80 mg by mouth once for 1 dose. 10/08/18 10/08/18  Coral Spikes, DO  oseltamivir (TAMIFLU) 75 MG capsule Take 1 capsule (75 mg total) by mouth every 12 (twelve) hours. 10/08/18   Coral Spikes, DO    Family History Family History  Problem Relation Age of Onset  . Hypertension Father   . Stroke Father   . CAD Father   . Hyperlipidemia Father   . Diabetes Father   . Stroke Paternal Aunt     Social History Social History   Tobacco Use  . Smoking status: Never Smoker  . Smokeless tobacco: Never Used  Substance Use Topics  . Alcohol use: Not Currently    Alcohol/week: 14.0 standard drinks    Types: 12 Cans of beer, 2 Shots of liquor per week    Comment: 3 x week beer and liquor  . Drug use: No     Allergies   Patient has no known allergies.   Review of Systems Review of Systems Per HPI  Physical Exam Triage Vital Signs ED Triage Vitals  Enc Vitals Group     BP 10/08/18 1226 (!) 130/93     Pulse Rate 10/08/18 1226 92     Resp 10/08/18 1226 18     Temp 10/08/18 1226 98.2 F (36.8 C)     Temp Source 10/08/18 1226 Oral     SpO2 10/08/18 1226 96 %     Weight 10/08/18 1222 240 lb (108.9 kg)     Height 10/08/18 1222 6\' 1"  (1.854 m)     Head Circumference --      Peak Flow --      Pain Score 10/08/18 1222 5     Pain Loc --      Pain Edu? --      Excl. in [place]? --    Updated Vital Signs BP (!) 130/93 (BP Location: Left Arm)   Pulse 92   Temp 98.2 F (36.8 C) (Oral)   Resp 18   Ht 6\' 1"  (1.854 m)   Wt 108.9 kg   SpO2 96%   BMI 31.66 kg/m   Visual Acuity Right Eye Distance:   Left Eye Distance:   Bilateral Distance:    Right Eye Near:   Left Eye Near:    Bilateral Near:     Physical Exam Vitals signs and nursing note reviewed.  Constitutional:      Appearance: Normal appearance.     Comments: Appears fatigued but in no  acute distress.  HENT:     Head: Normocephalic and atraumatic.     Right Ear: Tympanic membrane normal.     Left Ear: Tympanic membrane normal.     Mouth/Throat:     Pharynx: Oropharynx is clear. No posterior oropharyngeal  erythema.  Eyes:     General:        Right eye: No discharge.        Left eye: No discharge.     Conjunctiva/sclera: Conjunctivae normal.  Cardiovascular:     Rate and Rhythm: Normal rate and regular rhythm.  Pulmonary:     Effort: Pulmonary effort is normal.     Breath sounds: Normal breath sounds. No wheezing, rhonchi or rales.  Neurological:     Mental Status: He is alert.  Psychiatric:        Mood and Affect: Mood normal.        Behavior: Behavior normal.    UC Treatments / Results  Labs (all labs ordered are listed, but only abnormal results are displayed) Labs Reviewed  RAPID INFLUENZA A&B ANTIGENS ([place])    EKG None  Radiology Dg Chest 2 View  Result Date: 10/08/2018 CLINICAL DATA:  Shortness of breath and fever EXAM: CHEST - 2 VIEW COMPARISON:  None. FINDINGS: Lungs are clear. Heart size and pulmonary vascularity are normal. No adenopathy. No bone lesions. IMPRESSION: No edema or consolidation. Electronically Signed   By: Lowella Grip III M.D.   On: 10/08/2018 14:13    Procedures Procedures (including critical care time)  Medications Ordered in UC Medications  ondansetron (ZOFRAN-ODT) disintegrating tablet 8 mg (8 mg Oral Given 10/08/18 1356)    Initial Impression / Assessment and Plan / UC Course  I have reviewed the triage vital signs and the nursing notes.  Pertinent labs & imaging results that were available during my care of the patient were reviewed by me and considered in my medical decision making (see chart for details).    48 year old male presents with influenza-like illness.  Concern for influenza despite negative testing.  Chest x-ray negative.  Treating with Xofluza.  Tamiflu to be filled if he is unable to get  the medication.   Final Clinical Impressions(s) / UC Diagnoses   Final diagnoses:  Influenza-like illness     Discharge Instructions     Rest. Fluids.  Medication as prescribed.  Take care  Dr. Lacinda Axon    ED Prescriptions    Medication Sig Dispense Auth. Provider   oseltamivir (TAMIFLU) 75 MG capsule Take 1 capsule (75 mg total) by mouth every 12 (twelve) hours. 10 capsule Rechy Bost G, DO   Baloxavir Marboxil,80 MG Dose, (XOFLUZA) 2 x 40 MG TBPK Take 80 mg by mouth once for 1 dose. 2 each Coral Spikes, DO     Controlled Substance Prescriptions [place] Controlled Substance Registry consulted? Not Applicable   Coral Spikes, DO 10/08/18 1518

## 2018-10-08 NOTE — Discharge Instructions (Addendum)
Rest. Fluids.  Medication as prescribed.   Take care  Dr. Remiel Corti  

## 2018-10-08 NOTE — ED Triage Notes (Signed)
Pt c/o cough, fever (100), nausea, vomiting, nasal congestion, body aches, sweats, and chills. Cough started about 5 days ago but the fever and other symptoms started last night.

## 2019-01-08 IMAGING — CT CT ABD-PELV W/ CM
2 of 5 series · 15 of 46 positions shown, 17 images · IV contrast (APPLIED)
Comparison: None.

CLINICAL DATA: 46-year-old male with right lower quadrant pain.
Initial encounter.

EXAM:
CT ABDOMEN AND PELVIS WITH CONTRAST
TECHNIQUE: Multidetector CT imaging of the abdomen and pelvis was performed
using the standard protocol following bolus administration of
intravenous contrast.
CONTRAST:  100mL AVDM3X-R88 IOPAMIDOL (AVDM3X-R88) INJECTION 61%

[Series 2: axial st · axial · 0.88mm/px · z∈[-537,-112]mm · 12 of 97 slices shown, 14 images]
[im 6/97  soft-tissue]
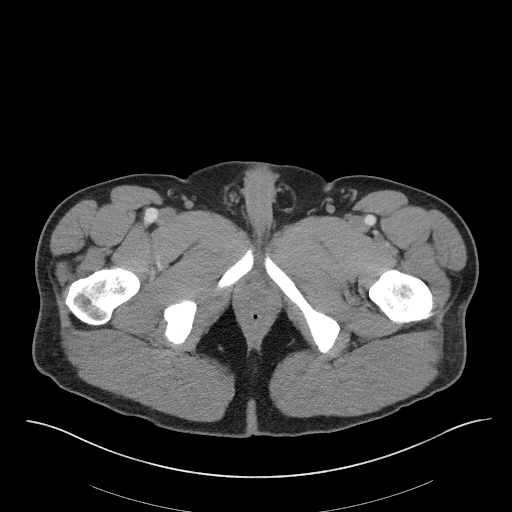
[im 6/97  bone]
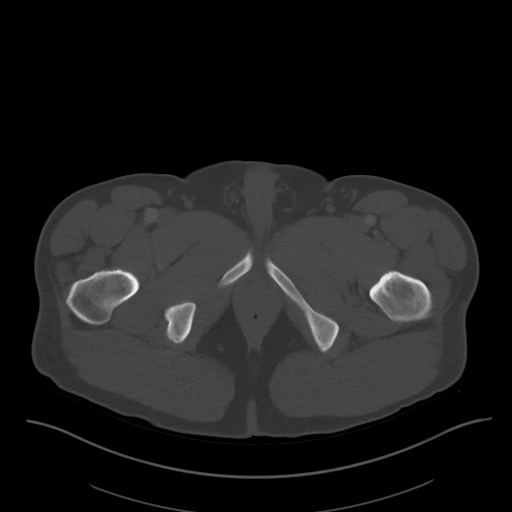
[im 16/97  soft-tissue]
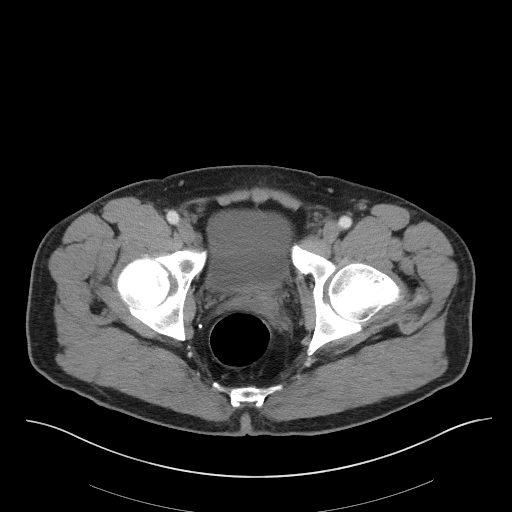
[im 21/97  soft-tissue]
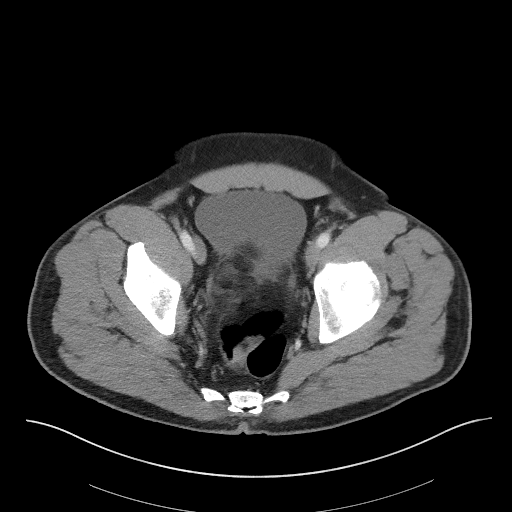
[im 31/97  soft-tissue]
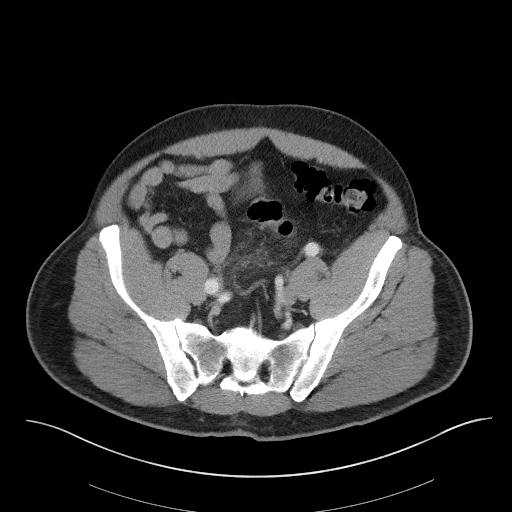
[im 36/97  soft-tissue]
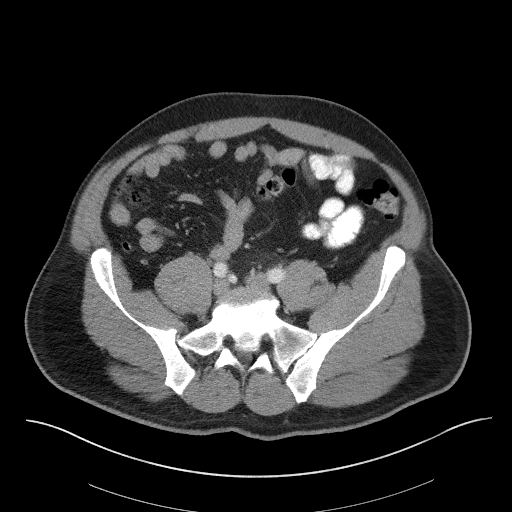
[im 46/97  soft-tissue]
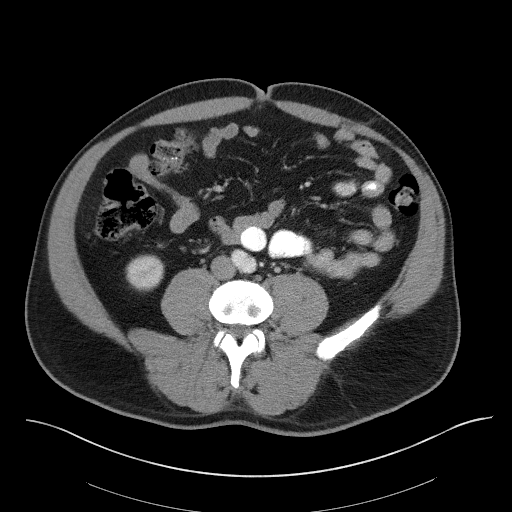
[im 51/97  soft-tissue]
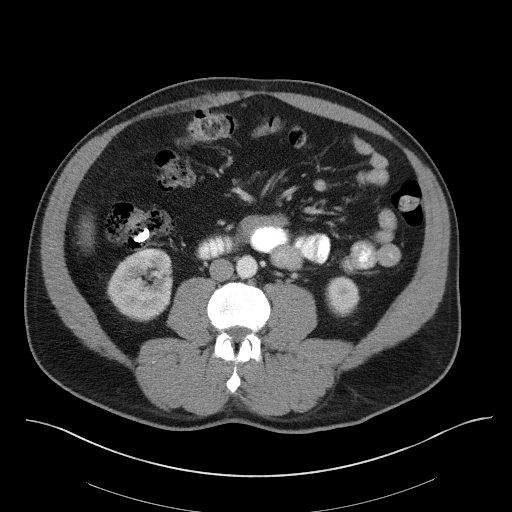
[im 61/97  soft-tissue]
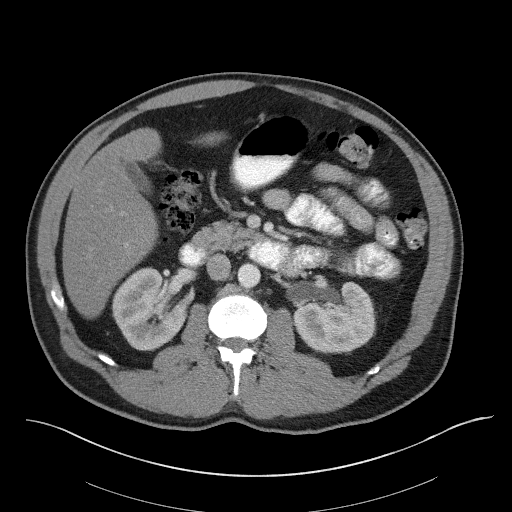
[im 66/97  soft-tissue]
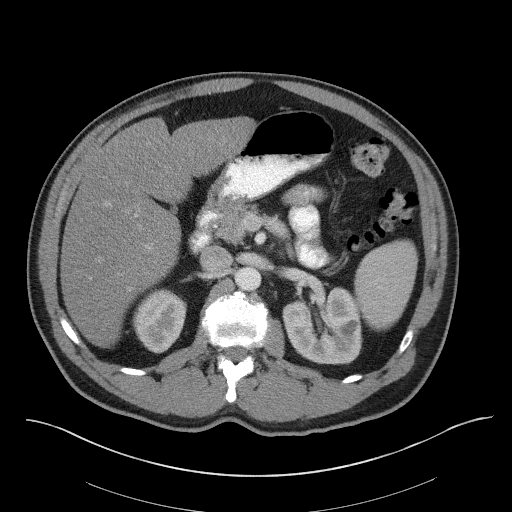
[im 66/97  bone]
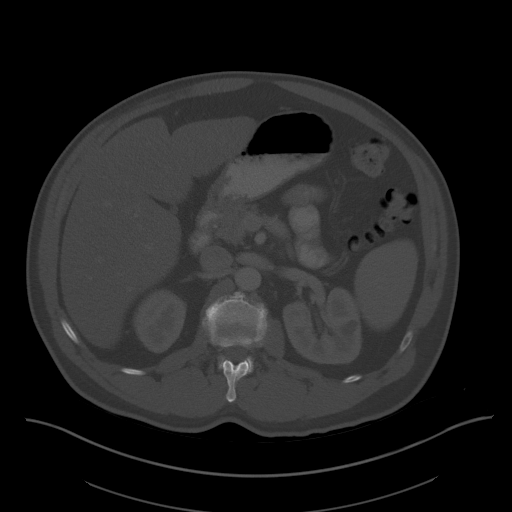
[im 76/97  soft-tissue]
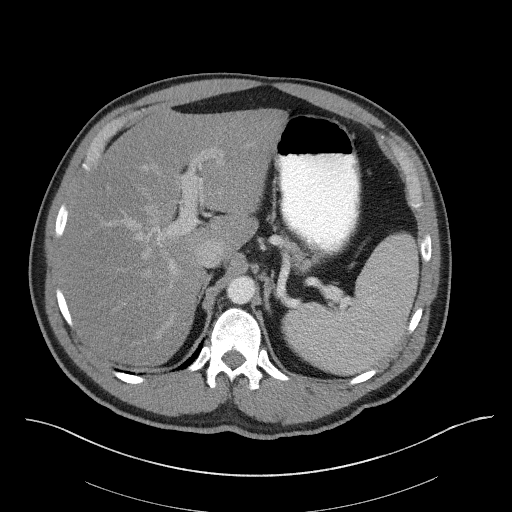
[im 81/97  soft-tissue]
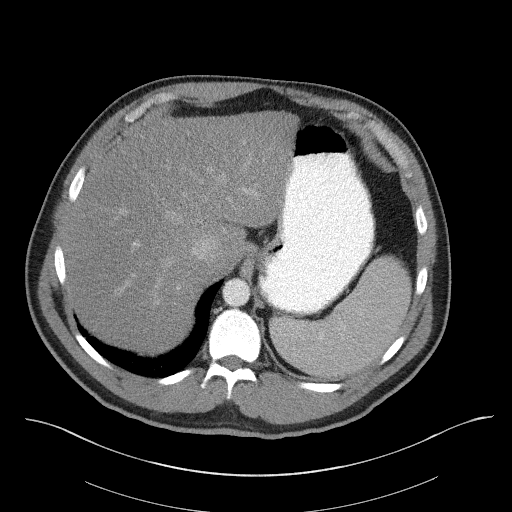
[im 91/97  soft-tissue]
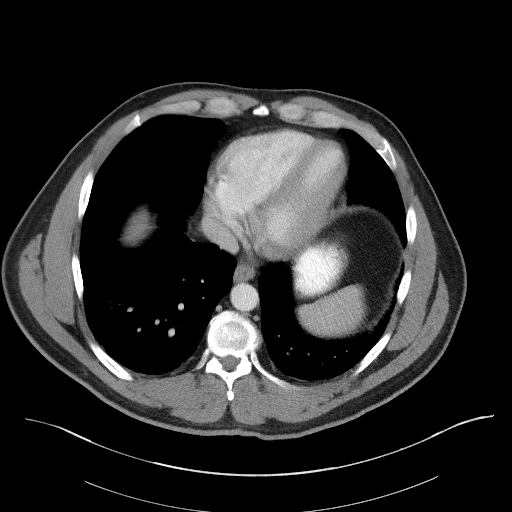

[Series 5: coronal st · coronal · 0.78mm/px · 3 of 104 slices shown]
[im 35/104  soft-tissue]
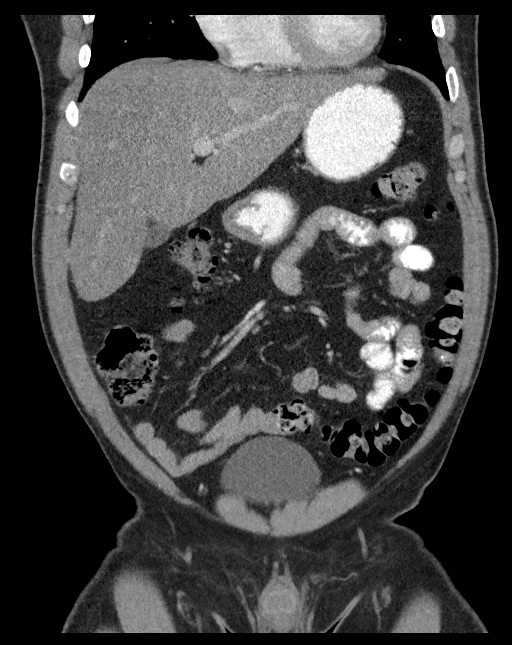
[im 46/104  soft-tissue]
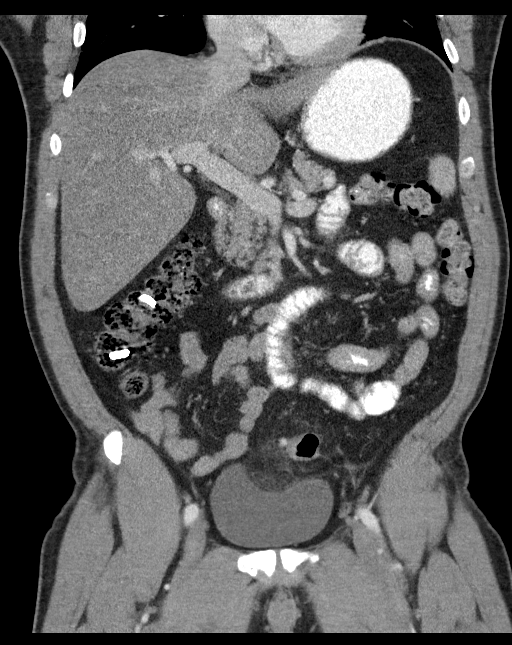
[im 58/104  soft-tissue]
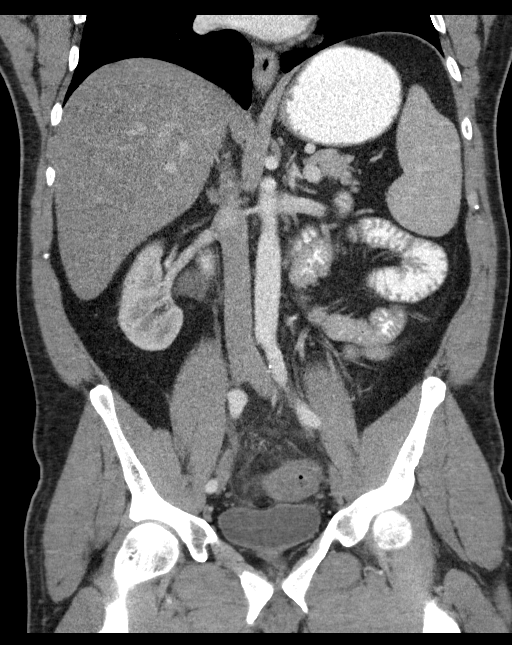

[15 of 46 positions shown; findings below may reference images not displayed]

FINDINGS: Lower chest: Basilar subsegmental atelectasis. Heart size
top-normal.

Hepatobiliary: Enlarged fatty liver spanning over 19.8 cm. No
worrisome hepatic lesion. Contracted gallbladder without calcified
gallstones.

Pancreas: No pancreatic mass or inflammation.

Spleen: Splenomegaly with spleen spanning over 14.5 cm. No focal
splenic lesion.

Adrenals/Urinary Tract: No obstructing stone or hydronephrosis.
Extra renal pelvis on the right. Left renal 1.2 cm cyst. Left lower
pole tiny nonobstructing stone.

No adrenal mass.

Compression of posterior aspect of the urinary bladder by
diverticulitis and inflammatory process. Noncontrast filled views of
the urinary bladder without polypoid abnormality noted.

Stomach/Bowel: Diffuse inflammatory process surrounding the sigmoid
colon extending to involving sigmoid mesentery probably related to
diverticulitis. Circumferential thickening of the inflamed sigmoid
colon. This can be assessed on follow-up to exclude underlying mass.
No drainable abscess. No gross free intraperitoneal air. The
inflammatory process causes mass effect upon the posterior aspect of
the urinary bladder.

Otherwise no primary bowel inflammatory process identified.

Vascular/Lymphatic: No aortic aneurysm. Atherosclerotic changes
iliac arteries. No large vessel occlusion.

No adenopathy.

Reproductive: Top-normal size prostate gland.

Other: No bowel containing hernia.

Musculoskeletal: Mild degenerative changes lower thoracic lumbar
spine. No worrisome osseous abnormality.
IMPRESSION: Sigmoid diverticulitis suspected as detailed above. No drainable
abscess. No gross free intraperitoneal air. The inflammatory process
causes mass effect upon the posterior aspect of the urinary bladder.

Enlarged fatty liver spanning over 19.8 cm.

Splenomegaly with spleen spanning over 14.5 cm.

Left renal 1.2 cm cyst. Left lower pole tiny nonobstructing renal
calculi.

These results were called by telephone at the time of interpretation
on 10/15/2017 at [DATE] to Dr. JHOHNNY POGYO , who verbally
acknowledged these results.

## 2019-10-22 ENCOUNTER — Ambulatory Visit: Payer: PRIVATE HEALTH INSURANCE | Admitting: Gastroenterology

## 2019-12-23 ENCOUNTER — Ambulatory Visit: Payer: PRIVATE HEALTH INSURANCE | Admitting: Dermatology

## 2020-09-01 ENCOUNTER — Other Ambulatory Visit: Payer: PRIVATE HEALTH INSURANCE

## 2020-09-29 ENCOUNTER — Other Ambulatory Visit: Payer: Self-pay | Admitting: Internal Medicine

## 2020-09-29 DIAGNOSIS — R3129 Other microscopic hematuria: Secondary | ICD-10-CM

## 2020-10-05 ENCOUNTER — Ambulatory Visit: Payer: PRIVATE HEALTH INSURANCE

## 2020-10-13 ENCOUNTER — Encounter: Payer: Self-pay | Admitting: Urology

## 2020-10-13 ENCOUNTER — Other Ambulatory Visit: Payer: Self-pay | Admitting: Radiology

## 2020-10-13 ENCOUNTER — Ambulatory Visit (INDEPENDENT_AMBULATORY_CARE_PROVIDER_SITE_OTHER): Payer: PRIVATE HEALTH INSURANCE | Admitting: Urology

## 2020-10-13 ENCOUNTER — Other Ambulatory Visit: Payer: Self-pay

## 2020-10-13 VITALS — BP 165/92 | HR 74 | Ht 73.0 in | Wt 241.0 lb

## 2020-10-13 DIAGNOSIS — N201 Calculus of ureter: Secondary | ICD-10-CM | POA: Diagnosis not present

## 2020-10-13 DIAGNOSIS — N1831 Chronic kidney disease, stage 3a: Secondary | ICD-10-CM

## 2020-10-13 DIAGNOSIS — N1330 Unspecified hydronephrosis: Secondary | ICD-10-CM

## 2020-10-13 DIAGNOSIS — R109 Unspecified abdominal pain: Secondary | ICD-10-CM | POA: Diagnosis not present

## 2020-10-13 LAB — URINALYSIS, COMPLETE
Bilirubin, UA: NEGATIVE
Glucose, UA: NEGATIVE
Ketones, UA: NEGATIVE
Leukocytes,UA: NEGATIVE
Nitrite, UA: NEGATIVE
Protein,UA: NEGATIVE
Specific Gravity, UA: 1.02 (ref 1.005–1.030)
Urobilinogen, Ur: 0.2 mg/dL (ref 0.2–1.0)
pH, UA: 6.5 (ref 5.0–7.5)

## 2020-10-13 LAB — MICROSCOPIC EXAMINATION
Bacteria, UA: NONE SEEN
WBC, UA: NONE SEEN /hpf (ref 0–5)

## 2020-10-13 MED ORDER — HYDROCODONE-ACETAMINOPHEN 5-325 MG PO TABS: 1.0000 | ORAL_TABLET | Freq: Four times a day (QID) | ORAL | 0 refills | Status: DC | PRN

## 2020-10-13 NOTE — H&P (View-Only) (Signed)
10/13/2020 11:26 AM   Cory Johnston October 05, 1970 [id number]  Referring provider: Adin Hector, MD [facility] [place],  [place] [address]  Chief Complaint  Patient presents with  . Nephrolithiasis    New Patient    HPI: 50 year old male who presents today for further evaluation of left proximal ureteral calculus.  He was seen and evaluated by his primary care physician complaining of left lower quadrant pain.  He is also incidentally found to have microscopic hematuria by his primary care physician and thus a CT stone protocol was ordered.  This was performed at an outside facility.  I was provided with a disc that was performed at [facility] of the CT abdomen pelvis today.  Left proximal ureteral stone measuring 11 mm.  Stone to skin distance approximately 12 cm.  Hounsfield units approximately 500.  Date of study was 10/10/2020.  Cr 1.3 on most recent labs.   This is been slightly worse over the past 1 to 2 years.  He reports that he has been dealing with this issue he believes for possibly several years.  He said incidental microscopic blood for quite some time.  Initially attributed his pain to diverticulitis as it comes and goes but on this occasion, it was more severe.  He denies a personal history of stones other than very remotely which he was able to pass spontaneously.  He is never required surgical intervention for stones.  He denies any urinary symptoms.  No gross hematuria or dysuria.  He is having fairly significant pain at times albeit not currently.  He is requesting pain medications.  PMH: Past Medical History:  Diagnosis Date  . Acute diverticulitis 10/15/2017  . Anxiety   . Complication of anesthesia   . Elevated ferritin 08/04/2015  . Fatty liver   . Gout    hx of  . Hallux valgus of left foot   . Hyperlipemia, mixed   . Hypertension    controlled on meds  . Nephrolithiasis   . PONV (postoperative nausea and vomiting)   .  Sleep apnea    cpap    Surgical History: Past Surgical History:  Procedure Laterality Date  . Barbie Banner OSTEOTOMY Left 02/01/2016   Procedure: Barbie Banner OSTEOTOMY [place] LEFT 1ST METATARSAL;  Surgeon: Samara Deist, DPM;  Location: [place];  Service: Podiatry;  Laterality: Left;  WITH POPLITEAL CPAP  . ANKLE FRACTURE SURGERY Left   . COLONOSCOPY WITH PROPOFOL N/A 01/23/2018   Procedure: COLONOSCOPY WITH PROPOFOL;  Surgeon: Lucilla Lame, MD;  Location: [place];  Service: Endoscopy;  Laterality: N/A;  . POLYPECTOMY N/A 01/23/2018   Procedure: POLYPECTOMY;  Surgeon: Lucilla Lame, MD;  Location: [place];  Service: Endoscopy;  Laterality: N/A;  . TONSILLECTOMY AND ADENOIDECTOMY    . TRIGGER FINGER RELEASE    . VASECTOMY      Home Medications:  Allergies as of 10/13/2020   No Known Allergies     Medication List       Accurate as of October 13, 2020 11:26 AM. If you have any questions, ask your nurse or doctor.        STOP taking these medications   ALPRAZolam 0.5 MG tablet Commonly known as: Duanne Moron Stopped by: Hollice Espy, MD   aspirin EC 81 MG tablet Stopped by: Hollice Espy, MD   Biotin 1000 MCG tablet Stopped by: Hollice Espy, MD   Astrid Drafts [place] by: Hollice Espy, MD  oseltamivir 75 MG capsule Commonly known as: TAMIFLU Stopped by: Hollice Espy, MD   polyethylene glycol 17 g packet Commonly known as: MIRALAX / GLYCOLAX Stopped by: Hollice Espy, MD     TAKE these medications   amLODipine 5 MG tablet Commonly known as: NORVASC Take 5 mg by mouth daily.   escitalopram 5 MG tablet Commonly known as: LEXAPRO Take 2.5 mg by mouth daily.   fenofibrate 160 MG tablet Take 160 mg by mouth daily.   HYDROcodone-acetaminophen 5-325 MG tablet Commonly known as: NORCO/VICODIN Take 1-2 tablets by mouth every 6 (six) hours as needed for moderate pain. Started by: Hollice Espy, MD   ibuprofen 200 MG  tablet Commonly known as: ADVIL Take by mouth.   Multivitamin Men Tabs Take 1 tablet by mouth daily.   niacin 500 MG tablet Commonly known as: SLO-NIACIN Take 500 mg by mouth at bedtime.   omega-3 acid ethyl esters 1 g capsule Commonly known as: LOVAZA Take by mouth.   ondansetron 4 MG tablet Commonly known as: ZOFRAN Take 1 tablet (4 mg total) by mouth every 8 (eight) hours as needed for nausea or vomiting.   psyllium 58.6 % powder Commonly known as: METAMUCIL Take 1 packet by mouth 2 (two) times daily.   testosterone cypionate 200 MG/ML injection Commonly known as: DEPOTESTOSTERONE CYPIONATE Inject 1 mL into the muscle every 14 (fourteen) days.       Allergies: No Known Allergies  Family History: Family History  Problem Relation Age of Onset  . Hypertension Father   . Stroke Father   . CAD Father   . Hyperlipidemia Father   . Diabetes Father   . Stroke Paternal Aunt     Social History:  reports that he has never smoked. He has never used smokeless tobacco. He reports previous alcohol use of about 14.0 standard drinks of alcohol per week. He reports that he does not use drugs.   Physical Exam: BP (!) 165/92   Pulse 74   Ht 6\' 1"  (1.854 m)   Wt 241 lb (109.3 kg)   BMI 31.80 kg/m   Constitutional:  Alert and oriented, No acute distress. HEENT: [place] AT, moist mucus membranes.  Trachea midline, no masses. Cardiovascular: No clubbing, cyanosis, or edema. Respiratory: Normal respiratory effort, no increased work of breathing. GI: Abdomen is soft, nontender, nondistended, no abdominal masses Skin: No rashes, bruises or suspicious lesions. Neurologic: Grossly intact, no focal deficits, moving all 4 extremities. Psychiatric: Normal mood and affect.  Laboratory Data: Lab Results  Component Value Date   WBC 9.1 10/16/2017   HGB 14.3 10/16/2017   HCT 41.1 10/16/2017   MCV 86.5 10/16/2017   PLT 177 10/16/2017    Lab Results  Component Value Date    CREATININE 1.22 10/16/2017    Urinalysis   Pertinent Imaging: CT scan was personally reviewed, agree with radiologic interpretation. I have the disc.  Assessment & Plan:    1. Left ureteral stone Large symptomatic left leg millimeter proximal ureteral stone which is likely somewhat chronic  We discussed various treatment options for urolithiasis including observation with or without medical expulsive therapy, shockwave lithotripsy (SWL), ureteroscopy and laser lithotripsy with stent placement, and percutaneous nephrolithotomy.   We discussed that management is based on stone size, location, density, patient co-morbidities, and patient preference.    Stones <82mm in size have a >80% spontaneous passage rate. Data surrounding the use of tamsulosin for medical expulsive therapy is controversial, but meta analyses suggests it is most  efficacious for distal stones between 5-1mm in size. Possible side effects include dizziness/lightheadedness, and retrograde ejaculation.   SWL has a lower stone free rate in a single procedure, but also a lower complication rate compared to ureteroscopy and avoids a stent and associated stent related symptoms. Possible complications include renal hematoma, steinstrasse, and need for additional treatment. We discussed the role of his increased skin to stone distance can lead to decreased efficacy with shockwave lithotripsy.   Ureteroscopy with laser lithotripsy and stent placement has a higher stone free rate than SWL in a single procedure, however increased complication rate including possible infection, ureteral injury, bleeding, and stent related morbidity. Common stent related symptoms include dysuria, urgency/frequency, and flank pain.   After an extensive discussion of the risks and benefits of the above treatment options, the patient would like to proceed with ESWL.  I did go ahead and give him some pain medication at his request, number 10 tablets hopefully  which can last him through his procedure given the chronicity.  In the interim, warning symptoms reviewed including indications for more urgent/emergent evaluation.  Preop UA/urine culture.  - Urinalysis, Complete - CULTURE, URINE COMPREHENSIVE  2. Hydronephrosis, left Secondary to obstructing stone  3. Left flank pain Pain management as above, NSAIDs until 3 days prior to the procedure then will need to be held for the procedure itself  4. Stage 3a chronic kidney disease ([place]) Worsening renal function in the setting of obstruction, given the chronicity may or may not improve with time    Hollice Espy, MD  [facility] [address], [place], [place] [address] [phone]

## 2020-10-13 NOTE — Progress Notes (Signed)
10/13/2020 11:26 AM   Cory Johnston Jul 31, 1971 [id number]  Referring provider: Adin Hector, MD [facility],  [place] [address]  Chief Complaint  Patient presents with  . Nephrolithiasis    New Patient    HPI: 50 year old male who presents today for further evaluation of left proximal ureteral calculus.  He was seen and evaluated by his primary care physician complaining of left lower quadrant pain.  He is also incidentally found to have microscopic hematuria by his primary care physician and thus a CT stone protocol was ordered.  This was performed at an outside facility.  I was provided with a disc that was performed at [facility] of the CT abdomen pelvis today.  Left proximal ureteral stone measuring 11 mm.  Stone to skin distance approximately 12 cm.  Hounsfield units approximately 500.  Date of study was 10/10/2020.  Cr 1.3 on most recent labs.   This is been slightly worse over the past 1 to 2 years.  He reports that he has been dealing with this issue he believes for possibly several years.  He said incidental microscopic blood for quite some time.  Initially attributed his pain to diverticulitis as it comes and goes but on this occasion, it was more severe.  He denies a personal history of stones other than very remotely which he was able to pass spontaneously.  He is never required surgical intervention for stones.  He denies any urinary symptoms.  No gross hematuria or dysuria.  He is having fairly significant pain at times albeit not currently.  He is requesting pain medications.  PMH: Past Medical History:  Diagnosis Date  . Acute diverticulitis 10/15/2017  . Anxiety   . Complication of anesthesia   . Elevated ferritin 08/04/2015  . Fatty liver   . Gout    hx of  . Hallux valgus of left foot   . Hyperlipemia, mixed   . Hypertension    controlled on meds  . Nephrolithiasis   . PONV (postoperative nausea and vomiting)   .  Sleep apnea    cpap    Surgical History: Past Surgical History:  Procedure Laterality Date  . Barbie Banner OSTEOTOMY Left 02/01/2016   Procedure: Barbie Banner OSTEOTOMY [place] LEFT 1ST METATARSAL;  Surgeon: Samara Deist, DPM;  Location: [facility];  Service: Podiatry;  Laterality: Left;  WITH POPLITEAL CPAP  . ANKLE FRACTURE SURGERY Left   . COLONOSCOPY WITH PROPOFOL N/A 01/23/2018   Procedure: COLONOSCOPY WITH PROPOFOL;  Surgeon: Lucilla Lame, MD;  Location: [facility];  Service: Endoscopy;  Laterality: N/A;  . POLYPECTOMY N/A 01/23/2018   Procedure: POLYPECTOMY;  Surgeon: Lucilla Lame, MD;  Location: [facility];  Service: Endoscopy;  Laterality: N/A;  . TONSILLECTOMY AND ADENOIDECTOMY    . TRIGGER FINGER RELEASE    . VASECTOMY      Home Medications:  Allergies as of 10/13/2020   No Known Allergies     Medication List       Accurate as of October 13, 2020 11:26 AM. If you have any questions, ask your nurse or doctor.        STOP taking these medications   ALPRAZolam 0.5 MG tablet Commonly known as: Duanne Moron Stopped by: Hollice Espy, MD   aspirin EC 81 MG tablet Stopped by: Hollice Espy, MD   Biotin 1000 MCG tablet Stopped by: Hollice Espy, MD   Astrid Drafts [place] by: Hollice Espy, MD  oseltamivir 75 MG capsule Commonly known as: TAMIFLU Stopped by: Hollice Espy, MD   polyethylene glycol 17 g packet Commonly known as: MIRALAX / GLYCOLAX Stopped by: Hollice Espy, MD     TAKE these medications   amLODipine 5 MG tablet Commonly known as: NORVASC Take 5 mg by mouth daily.   escitalopram 5 MG tablet Commonly known as: LEXAPRO Take 2.5 mg by mouth daily.   fenofibrate 160 MG tablet Take 160 mg by mouth daily.   HYDROcodone-acetaminophen 5-325 MG tablet Commonly known as: NORCO/VICODIN Take 1-2 tablets by mouth every 6 (six) hours as needed for moderate pain. Started by: Hollice Espy, MD   ibuprofen 200 MG  tablet Commonly known as: ADVIL Take by mouth.   Multivitamin Men Tabs Take 1 tablet by mouth daily.   niacin 500 MG tablet Commonly known as: SLO-NIACIN Take 500 mg by mouth at bedtime.   omega-3 acid ethyl esters 1 g capsule Commonly known as: LOVAZA Take by mouth.   ondansetron 4 MG tablet Commonly known as: ZOFRAN Take 1 tablet (4 mg total) by mouth every 8 (eight) hours as needed for nausea or vomiting.   psyllium 58.6 % powder Commonly known as: METAMUCIL Take 1 packet by mouth 2 (two) times daily.   testosterone cypionate 200 MG/ML injection Commonly known as: DEPOTESTOSTERONE CYPIONATE Inject 1 mL into the muscle every 14 (fourteen) days.       Allergies: No Known Allergies  Family History: Family History  Problem Relation Age of Onset  . Hypertension Father   . Stroke Father   . CAD Father   . Hyperlipidemia Father   . Diabetes Father   . Stroke Paternal Aunt     Social History:  reports that he has never smoked. He has never used smokeless tobacco. He reports previous alcohol use of about 14.0 standard drinks of alcohol per week. He reports that he does not use drugs.   Physical Exam: BP (!) 165/92   Pulse 74   Ht 6\' 1"  (1.854 m)   Wt 241 lb (109.3 kg)   BMI 31.80 kg/m   Constitutional:  Alert and oriented, No acute distress. HEENT: [place] AT, moist mucus membranes.  Trachea midline, no masses. Cardiovascular: No clubbing, cyanosis, or edema. Respiratory: Normal respiratory effort, no increased work of breathing. GI: Abdomen is soft, nontender, nondistended, no abdominal masses Skin: No rashes, bruises or suspicious lesions. Neurologic: Grossly intact, no focal deficits, moving all 4 extremities. Psychiatric: Normal mood and affect.  Laboratory Data: Lab Results  Component Value Date   WBC 9.1 10/16/2017   HGB 14.3 10/16/2017   HCT 41.1 10/16/2017   MCV 86.5 10/16/2017   PLT 177 10/16/2017    Lab Results  Component Value Date    CREATININE 1.22 10/16/2017    Urinalysis   Pertinent Imaging: CT scan was personally reviewed, agree with radiologic interpretation. I have the disc.  Assessment & Plan:    1. Left ureteral stone Large symptomatic left leg millimeter proximal ureteral stone which is likely somewhat chronic  We discussed various treatment options for urolithiasis including observation with or without medical expulsive therapy, shockwave lithotripsy (SWL), ureteroscopy and laser lithotripsy with stent placement, and percutaneous nephrolithotomy.   We discussed that management is based on stone size, location, density, patient co-morbidities, and patient preference.    Stones <14mm in size have a >80% spontaneous passage rate. Data surrounding the use of tamsulosin for medical expulsive therapy is controversial, but meta analyses suggests it is most  efficacious for distal stones between 5-64mm in size. Possible side effects include dizziness/lightheadedness, and retrograde ejaculation.   SWL has a lower stone free rate in a single procedure, but also a lower complication rate compared to ureteroscopy and avoids a stent and associated stent related symptoms. Possible complications include renal hematoma, steinstrasse, and need for additional treatment. We discussed the role of his increased skin to stone distance can lead to decreased efficacy with shockwave lithotripsy.   Ureteroscopy with laser lithotripsy and stent placement has a higher stone free rate than SWL in a single procedure, however increased complication rate including possible infection, ureteral injury, bleeding, and stent related morbidity. Common stent related symptoms include dysuria, urgency/frequency, and flank pain.   After an extensive discussion of the risks and benefits of the above treatment options, the patient would like to proceed with ESWL.  I did go ahead and give him some pain medication at his request, number 10 tablets hopefully  which can last him through his procedure given the chronicity.  In the interim, warning symptoms reviewed including indications for more urgent/emergent evaluation.  Preop UA/urine culture.  - Urinalysis, Complete - CULTURE, URINE COMPREHENSIVE  2. Hydronephrosis, left Secondary to obstructing stone  3. Left flank pain Pain management as above, NSAIDs until 3 days prior to the procedure then will need to be held for the procedure itself  4. Stage 3a chronic kidney disease ([place]) Worsening renal function in the setting of obstruction, given the chronicity may or may not improve with time    Hollice Espy, MD  [facility] [address] [place], [place] [address] [phone]

## 2020-10-16 LAB — CULTURE, URINE COMPREHENSIVE

## 2020-10-17 LAB — CULTURE, URINE COMPREHENSIVE

## 2020-10-18 ENCOUNTER — Other Ambulatory Visit: Payer: Self-pay

## 2020-10-18 ENCOUNTER — Other Ambulatory Visit
Admission: RE | Admit: 2020-10-18 | Discharge: 2020-10-18 | Disposition: A | Discharge status: Home / Self Care | Payer: PRIVATE HEALTH INSURANCE | Source: Ambulatory Visit | Attending: Urology | Admitting: Urology

## 2020-10-18 DIAGNOSIS — Z20822 Contact with and (suspected) exposure to covid-19: Secondary | ICD-10-CM | POA: Insufficient documentation

## 2020-10-18 DIAGNOSIS — Z01812 Encounter for preprocedural laboratory examination: Secondary | ICD-10-CM | POA: Diagnosis not present

## 2020-10-18 LAB — SARS CORONAVIRUS 2 (TAT 6-24 HRS): SARS Coronavirus 2: NEGATIVE

## 2020-10-20 ENCOUNTER — Ambulatory Visit
Admission: RE | Admit: 2020-10-20 | Discharge: 2020-10-20 | Disposition: A | Discharge status: Home / Self Care | Payer: PRIVATE HEALTH INSURANCE | Attending: Urology | Admitting: Urology

## 2020-10-20 ENCOUNTER — Encounter
Admission: RE | Disposition: A | Discharge status: Home / Self Care | Payer: Self-pay | Source: Home / Self Care | Attending: Urology

## 2020-10-20 ENCOUNTER — Other Ambulatory Visit: Payer: Self-pay

## 2020-10-20 ENCOUNTER — Ambulatory Visit: Payer: PRIVATE HEALTH INSURANCE

## 2020-10-20 ENCOUNTER — Encounter: Payer: Self-pay | Admitting: Urology

## 2020-10-20 DIAGNOSIS — I10 Essential (primary) hypertension: Secondary | ICD-10-CM | POA: Diagnosis not present

## 2020-10-20 DIAGNOSIS — E669 Obesity, unspecified: Secondary | ICD-10-CM | POA: Diagnosis not present

## 2020-10-20 DIAGNOSIS — G4730 Sleep apnea, unspecified: Secondary | ICD-10-CM | POA: Insufficient documentation

## 2020-10-20 DIAGNOSIS — N202 Calculus of kidney with calculus of ureter: Secondary | ICD-10-CM | POA: Insufficient documentation

## 2020-10-20 DIAGNOSIS — N201 Calculus of ureter: Secondary | ICD-10-CM

## 2020-10-20 DIAGNOSIS — Z6831 Body mass index (BMI) 31.0-31.9, adult: Secondary | ICD-10-CM | POA: Insufficient documentation

## 2020-10-20 HISTORY — PX: EXTRACORPOREAL SHOCK WAVE LITHOTRIPSY: SHX1557

## 2020-10-20 SURGERY — LITHOTRIPSY, ESWL
Anesthesia: Moderate Sedation | Laterality: Left

## 2020-10-20 MED ORDER — DIPHENHYDRAMINE HCL 25 MG PO CAPS
ORAL_CAPSULE | ORAL | Status: AC
  Administered 2020-10-20: 25 mg via ORAL
  Filled 2020-10-20: qty 1

## 2020-10-20 MED ORDER — CEPHALEXIN 500 MG PO CAPS
ORAL_CAPSULE | ORAL | Status: AC
  Administered 2020-10-20: 500 mg via ORAL
  Filled 2020-10-20: qty 1

## 2020-10-20 MED ORDER — CEPHALEXIN 500 MG PO CAPS: 500.0000 mg | ORAL_CAPSULE | ORAL | Status: AC

## 2020-10-20 MED ORDER — SODIUM CHLORIDE 0.9 % IV SOLN: INTRAVENOUS | Status: DC

## 2020-10-20 MED ORDER — ONDANSETRON HCL 4 MG PO TABS: 4.0000 mg | ORAL_TABLET | Freq: Three times a day (TID) | ORAL | 0 refills | Status: AC | PRN

## 2020-10-20 MED ORDER — DIAZEPAM 5 MG PO TABS
ORAL_TABLET | ORAL | Status: AC
  Administered 2020-10-20: 10 mg via ORAL
  Filled 2020-10-20: qty 2

## 2020-10-20 MED ORDER — ONDANSETRON HCL 4 MG/2ML IJ SOLN: 4.0000 mg | Freq: Once | INTRAMUSCULAR | Status: DC

## 2020-10-20 MED ORDER — TAMSULOSIN HCL 0.4 MG PO CAPS: 0.4000 mg | ORAL_CAPSULE | Freq: Every day | ORAL | 0 refills | Status: DC

## 2020-10-20 MED ORDER — DIPHENHYDRAMINE HCL 25 MG PO CAPS: 25.0000 mg | ORAL_CAPSULE | ORAL | Status: AC

## 2020-10-20 MED ORDER — DIAZEPAM 5 MG PO TABS: 10.0000 mg | ORAL_TABLET | ORAL | Status: AC

## 2020-10-20 MED ORDER — HYDROCODONE-ACETAMINOPHEN 5-325 MG PO TABS: 1.0000 | ORAL_TABLET | ORAL | Status: DC | PRN

## 2020-10-20 MED ORDER — ONDANSETRON HCL 4 MG/2ML IJ SOLN
INTRAMUSCULAR | Status: AC
  Filled 2020-10-20: qty 2

## 2020-10-20 MED ORDER — HYDROCODONE-ACETAMINOPHEN 5-325 MG PO TABS: 1.0000 | ORAL_TABLET | Freq: Four times a day (QID) | ORAL | 0 refills | Status: AC | PRN

## 2020-10-20 MED ORDER — HYDROCODONE-ACETAMINOPHEN 5-325 MG PO TABS
ORAL_TABLET | ORAL | Status: AC
  Filled 2020-10-20: qty 1

## 2020-10-20 NOTE — Interval H&P Note (Signed)
UROLOGY H&P UPDATE  Agree with prior H&P dated 10/13/20 by [name], 1cm left proximal ureteral stone.  Cardiac: RRR Lungs: CTA bilaterally  Laterality: LEFT Procedure: shockwave lithotripsy  Urine: 1k streptococcus, suspect contaminant   Informed consent obtained, we specifically discussed the risks of bleeding/hematoma, infection, post-operative pain,obstructive fragments/steinstrasse, possible need for additional procedures.  Billey Co, MD 10/20/2020

## 2020-10-20 NOTE — OR Nursing (Signed)
Red spot on left flank. no break in skin

## 2020-10-20 NOTE — Brief Op Note (Signed)
10/20/2020  7:55 AM  PATIENT:  Cory Johnston  50 y.o. male  PRE-OPERATIVE DIAGNOSIS:  Left UPJ stone, 1cm  POST-OPERATIVE DIAGNOSIS:  Same  PROCEDURE:  Procedure(s): EXTRACORPOREAL SHOCK WAVE LITHOTRIPSY (ESWL) (Left)  SURGEON:  Surgeon(s) and Role:    * Billey Co, MD - Primary  ANESTHESIA: Conscious Sedation  EBL:  None  Drains: None  Specimen: None  Findings:  1. Stone smudged to multiple fragments, tolerated SWL well  DISPO: Flomax, pain meds PRN, RTC 2 weeks KUB  Nickolas Madrid, MD 10/20/2020

## 2020-10-20 NOTE — Discharge Instructions (Signed)

## 2020-10-21 ENCOUNTER — Encounter: Payer: Self-pay | Admitting: Urology

## 2020-11-06 NOTE — Progress Notes (Signed)
11/07/2020  1:12 PM   Cory Johnston Mar 07, 1971 [id number]  Referring provider: Adin Hector, MD [facility] [place],  [place] [address] Chief Complaint  Patient presents with  . Follow-up    2 week post op     HPI: Cory Johnston is a 50 y.o. male with a history of left ureteral stone and is 2 weeks post op ESWL with Dr. Diamantina Providence on 10/20/2020 returns for follow up.   Patient was evaluated by [name] on 10/13/2020 for a left proximal ureteral calculus. [name] was provided with a disc that was performed at [facility] of the CT abdomen pelvis today.  Left proximal ureteral stone measuring 11 mm.  Stone to skin distance approximately 12 cm.  Hounsfield units approximately 500.  Date of study was 10/10/2020.  He reported that he was dealing with this issue he believes for possibly several years.  He said incidental microscopic blood for quite some time.  Initially attributed his pain to diverticulitis as it comes and goes but on this occasion, it was more severe.  No personal history of stones other than very remotely which he was able to pass spontaneously. Patient denied required surgical intervention for stones.  He denied any urinary symptoms. No gross hematuria or dysuria. UA on 10/13/2020 noted trace RBC. Microscopic UA was normal. Associated urine culture showed streptococcus mitis/oralis.   Labs drawn by PCP [name] on 09/20/2020 noted PSA 0.50 ng/mL, testostoerone serum was 736 ng/dL. UA noted large blood. Cholesterol 229. Hemoglobin A1c was 5.8. CBC was WNL. Cr was 1.3. This is been slightly worse over the past 1 to 2 years.  Microscopic UA on 09/27/2020 revealed trace blood. Urine culture was negative.   Patient underwent ESWL on 10/20/2020 with Dr. Diamantina Providence. KUB noted a 10 mm stone noted over the left renal pelvis.  Today the patient complains of pain post KUB, until passed. The patient collected some of the calculi fragments, and feels  back to normal post passing them. Patient has history of diverticulitis and thought this stone felt similar to that pain. He states that he has had back pain. The patient denies hematuria. Patient reports stone was about a half inch in size. Patient feels that kidney function has returned back to normal since the KUB and passing the fragments. He wants to have his renal function checked again, by [name], in 6 months. Patient reported that it took him about a week to pass all of the stone fragments.   PMH: Past Medical History:  Diagnosis Date  . Acute diverticulitis 10/15/2017  . Anxiety   . Complication of anesthesia   . Elevated ferritin 08/04/2015  . Fatty liver   . Gout    hx of  . Hallux valgus of left foot   . Hyperlipemia, mixed   . Hypertension    controlled on meds  . Nephrolithiasis   . PONV (postoperative nausea and vomiting)   . Sleep apnea    cpap    Surgical History: Past Surgical History:  Procedure Laterality Date  . Barbie Banner OSTEOTOMY Left 02/01/2016   Procedure: Barbie Banner OSTEOTOMY [place] LEFT 1ST METATARSAL;  Surgeon: Samara Deist, DPM;  Location: [place];  Service: Podiatry;  Laterality: Left;  WITH POPLITEAL CPAP  . ANKLE FRACTURE SURGERY Left   . COLONOSCOPY WITH PROPOFOL N/A 01/23/2018   Procedure: COLONOSCOPY WITH PROPOFOL;  Surgeon: Lucilla Lame, MD;  Location: [place];  Service: Endoscopy;  Laterality:  N/A;  . EXTRACORPOREAL SHOCK WAVE LITHOTRIPSY Left 10/20/2020   Procedure: EXTRACORPOREAL SHOCK WAVE LITHOTRIPSY (ESWL);  Surgeon: Billey Co, MD;  Location: [facility];  Service: Urology;  Laterality: Left;  . POLYPECTOMY N/A 01/23/2018   Procedure: POLYPECTOMY;  Surgeon: Lucilla Lame, MD;  Location: [place];  Service: Endoscopy;  Laterality: N/A;  . TONSILLECTOMY AND ADENOIDECTOMY    . TRIGGER FINGER RELEASE    . VASECTOMY      Home Medications:  Allergies as of 11/07/2020   No Known Allergies     Medication List        Accurate as of November 07, 2020  1:12 PM. If you have any questions, ask your nurse or doctor.        STOP taking these medications   HYDROcodone-acetaminophen 5-325 MG tablet Commonly known as: NORCO/VICODIN Stopped by: [name], PA-C   omega-3 acid ethyl esters 1 g capsule Commonly known as: LOVAZA Stopped by: [name], PA-C   ondansetron 4 MG tablet Commonly known as: ZOFRAN Stopped by: [name], PA-C   tamsulosin 0.4 MG Caps capsule Commonly known as: FLOMAX Stopped by: [name], PA-C     TAKE these medications   amLODipine 5 MG tablet Commonly known as: NORVASC Take 5 mg by mouth daily.   escitalopram 5 MG tablet Commonly known as: LEXAPRO Take 2.5 mg by mouth daily.   fenofibrate 160 MG tablet Take 160 mg by mouth daily.   Fish Oil 1000 MG Caps Take 2 capsules by mouth in the morning and at bedtime.   ibuprofen 200 MG tablet Commonly known as: ADVIL Take by mouth.   Multivitamin Men Tabs Take 1 tablet by mouth daily.   niacin 500 MG tablet Commonly known as: SLO-NIACIN Take 500 mg by mouth at bedtime.   psyllium 58.6 % powder Commonly known as: METAMUCIL Take 1 packet by mouth 2 (two) times daily.   testosterone cypionate 200 MG/ML injection Commonly known as: DEPOTESTOSTERONE CYPIONATE Inject 1 mL into the muscle every 14 (fourteen) days.       Allergies: No Known Allergies  Family History: Family History  Problem Relation Age of Onset  . Hypertension Father   . Stroke Father   . CAD Father   . Hyperlipidemia Father   . Diabetes Father   . Stroke Paternal Aunt     Social History:  reports that he has never smoked. He has never used smokeless tobacco. He reports previous alcohol use of about 14.0 standard drinks of alcohol per week. He reports that he does not use drugs.   Physical Exam: BP 127/81   Pulse (!) 58   Ht 6\' 1"  (1.854 m)   Wt 238 lb (108 kg)   BMI 31.40 kg/m   Constitutional:  Alert and  oriented, No acute distress. HEENT: [place] AT, moist mucus membranes.  Trachea midline, no masses. Cardiovascular: No clubbing, cyanosis, or edema. Respiratory: Normal respiratory effort, no increased work of breathing. Skin: No rashes, bruises or suspicious lesions. Neurologic: Grossly intact, no focal deficits, moving all 4 extremities. Psychiatric: Normal mood and affect.  Laboratory Data:   Pertinent Imaging: CLINICAL DATA:  Left ureteral stone  EXAM: ABDOMEN - 1 VIEW  COMPARISON:  10/20/2020, CT 12/23/2017  FINDINGS: Nonobstructed bowel-gas pattern. Previously noted calcification to the left of L2-L3 is not clearly identified on the current exam. Stable calcified phleboliths in the pelvis.  IMPRESSION: Previously noted calcification to the left of L2-L3 is not clearly identified on the current  exam.   Electronically Signed   By: Donavan Foil M.D.   On: 11/08/2020 00:30 I have personally reviewed the images and agree with radiologist interpretation.    Assessment & Plan:    Left ureteral stone - KUB was unremarkable - Left ureteral stone is no longer present - Patient will return and bring the stones with him when he returns to the [facility] or [place] clinic. - Pending the blood work results from PCP.   No follow-ups on file.  I, Ardyth Gal, am acting as a Education administrator for [employer],  I have reviewed the above documentation for accuracy and completeness, and I agree with the above.    Zara Council, PA-C   [facility] [address] [place], [place] [address] [phone]

## 2020-11-07 ENCOUNTER — Ambulatory Visit: Payer: PRIVATE HEALTH INSURANCE | Admitting: Urology

## 2020-11-07 ENCOUNTER — Ambulatory Visit
Admission: RE | Admit: 2020-11-07 | Discharge: 2020-11-07 | Disposition: A | Discharge status: Home / Self Care | Payer: PRIVATE HEALTH INSURANCE | Attending: Urology | Admitting: Urology

## 2020-11-07 ENCOUNTER — Ambulatory Visit
Admission: RE | Admit: 2020-11-07 | Discharge: 2020-11-07 | Disposition: A | Discharge status: Home / Self Care | Payer: PRIVATE HEALTH INSURANCE | Source: Ambulatory Visit | Attending: Urology | Admitting: Urology

## 2020-11-07 ENCOUNTER — Other Ambulatory Visit: Payer: Self-pay

## 2020-11-07 ENCOUNTER — Encounter: Payer: Self-pay | Admitting: Urology

## 2020-11-07 VITALS — BP 127/81 | HR 58 | Ht 73.0 in | Wt 238.0 lb

## 2020-11-07 DIAGNOSIS — N201 Calculus of ureter: Secondary | ICD-10-CM

## 2021-01-04 ENCOUNTER — Encounter: Payer: Self-pay | Admitting: Dermatology

## 2021-01-04 ENCOUNTER — Ambulatory Visit: Payer: PRIVATE HEALTH INSURANCE | Admitting: Dermatology

## 2021-01-04 ENCOUNTER — Other Ambulatory Visit: Payer: Self-pay

## 2021-01-04 DIAGNOSIS — D171 Benign lipomatous neoplasm of skin and subcutaneous tissue of trunk: Secondary | ICD-10-CM | POA: Diagnosis not present

## 2021-01-04 DIAGNOSIS — D229 Melanocytic nevi, unspecified: Secondary | ICD-10-CM

## 2021-01-04 DIAGNOSIS — D1800 Hemangioma unspecified site: Secondary | ICD-10-CM

## 2021-01-04 DIAGNOSIS — L738 Other specified follicular disorders: Secondary | ICD-10-CM | POA: Diagnosis not present

## 2021-01-04 DIAGNOSIS — L814 Other melanin hyperpigmentation: Secondary | ICD-10-CM | POA: Diagnosis not present

## 2021-01-04 DIAGNOSIS — L570 Actinic keratosis: Secondary | ICD-10-CM

## 2021-01-04 DIAGNOSIS — L578 Other skin changes due to chronic exposure to nonionizing radiation: Secondary | ICD-10-CM

## 2021-01-04 NOTE — Progress Notes (Addendum)
   Follow-Up Visit   Subjective  Cory Johnston is a 50 y.o. male who presents for the following: Skin Problem (Patient here today with a spot at back that he would like checked. Patient's wife thinks it is getting bigger.).  Patient would also like his face checked since he has a lot of sun exposure. He does not have a hx of skin cancer but does have a hx of AK.    The following portions of the chart were reviewed this encounter and updated as appropriate:   Tobacco  Allergies  Meds  Problems  Med Hx  Surg Hx  Fam Hx      Review of Systems:  No other skin or systemic complaints except as noted in HPI or Assessment and Plan.  Objective  Well appearing patient in no apparent distress; mood and affect are within normal limits.  All skin waist up examined.  Objective  Upper back left of midline: 5.0 x 3.0cm rubbery nodule   Objective  Left dorsal hand x 2, left forearm x 1, left cheek x 1 (4): Erythematous thin papules/macules with gritty scale.   Objective  forehead: Small yellow papules with a central dell.    Assessment & Plan  Lipoma of torso Upper back left of midline  Favor Lipoma Benign appearing. Call for changes.  AK (actinic keratosis) (4) Left dorsal hand x 2, left forearm x 1, left cheek x 1  Prior to procedure, discussed risks of blister formation, small wound, skin dyspigmentation, or rare scar following cryotherapy.   Patient with AK's at lip. Patient defers treatment today. Recommend using lip balm with sunscreen and RTC if changes.    Destruction of lesion - Left dorsal hand x 2, left forearm x 1, left cheek x 1  Destruction method: cryotherapy   Informed consent: discussed and consent obtained   Lesion destroyed using liquid nitrogen: Yes   Cryotherapy cycles:  2 Outcome: patient tolerated procedure well with no complications   Post-procedure details: wound care instructions given    Sebaceous hyperplasia forehead  Benign-appearing.   Observation.  Call clinic for new or changing lesions.  Recommend daily use of broad spectrum spf 30+ sunscreen to sun-exposed areas.    Melanocytic Nevi - Tan-brown and/or pink-flesh-colored symmetric macules and papules - Benign appearing on exam today - Observation - Call clinic for new or changing moles - Recommend daily use of broad spectrum spf 30+ sunscreen to sun-exposed areas.   Lentigines - Scattered tan macules - Due to sun exposure - Benign-appering, observe - Recommend daily broad spectrum sunscreen SPF 30+ to sun-exposed areas, reapply every 2 hours as needed. - Call for any changes  Hemangiomas - Red papules - Discussed benign nature - Observe - Call for any changes  Actinic Damage - chronic, secondary to cumulative UV radiation exposure/sun exposure over time - diffuse scaly erythematous macules with underlying dyspigmentation - Recommend daily broad spectrum sunscreen SPF 30+ to sun-exposed areas, reapply every 2 hours as needed.  - Recommend staying in the shade or wearing long sleeves, sun glasses (UVA+UVB protection) and wide brim hats (4-inch brim around the entire circumference of the hat). - Call for new or changing lesions.  Return in about 4 months (around 05/07/2021) for AK follow up.  Graciella Belton, RMA, am acting as scribe for Forest Gleason, MD .  Documentation: I have reviewed the above documentation for accuracy and completeness, and I agree with the above.  Forest Gleason, MD

## 2021-01-04 NOTE — Patient Instructions (Addendum)
Cryotherapy Aftercare  . Wash gently with soap and water everyday.   . Apply Vaseline and Band-Aid daily until healed.  Prior to procedure, discussed risks of blister formation, small wound, skin dyspigmentation, or rare scar following cryotherapy.   Recommend taking Heliocare sun protection supplement daily in sunny weather for additional sun protection. For maximum protection on the sunniest days, you can take up to 2 capsules of regular Heliocare OR take 1 capsule of Heliocare Ultra. For prolonged exposure (such as a full day in the sun), you can repeat your dose of the supplement 4 hours after your first dose. Heliocare can be purchased at [facility] or at www.heliocare.com.   Recommend daily broad spectrum sunscreen SPF 30+ to sun-exposed areas, reapply every 2 hours as needed. Call for new or changing lesions.  Staying in the shade or wearing long sleeves, sun glasses (UVA+UVB protection) and wide brim hats (4-inch brim around the entire circumference of the hat) are also recommended for sun protection.  If you have any questions or concerns for your doctor, please call our main line at [phone] and press option 4 to reach your doctor's medical assistant. If no one answers, please leave a voicemail as directed and we will return your call as soon as possible. Messages left after 4 pm will be answered the following business day.   You may also send us a message via MyChart. We typically respond to MyChart messages within 1-2 business days.  For prescription refills, please ask your pharmacy to contact our office. Our fax number is [phone].  If you have an urgent issue when the clinic is closed that cannot wait until the next business day, you can page your doctor at the number below.    Please note that while we do our best to be available for urgent issues outside of office hours, we are not available 24/7.   If you have an urgent issue and are unable to reach us, you  may choose to seek medical care at your doctor's office, retail clinic, urgent care center, or emergency room.  If you have a medical emergency, please immediately call 911 or go to the emergency department.  Pager Numbers  - Dr. Kowalski: [phone]  - Dr. Moye: [phone]  - Dr. Stewart: [phone]  In the event of inclement weather, please call our main line at [phone] for an update on the status of any delays or closures.  Dermatology Medication Tips: Please keep the boxes that topical medications come in in order to help keep track of the instructions about where and how to use these. Pharmacies typically print the medication instructions only on the boxes and not directly on the medication tubes.   If your medication is too expensive, please contact our office at [phone] option 4 or send us a message through MyChart.   We are unable to tell what your co-pay for medications will be in advance as this is different depending on your insurance coverage. However, we may be able to find a substitute medication at lower cost or fill out paperwork to get insurance to cover a needed medication.   If a prior authorization is required to get your medication covered by your insurance company, please allow us 1-2 business days to complete this process.  Drug prices often vary depending on where the prescription is filled and some pharmacies may offer cheaper prices.  The website www.goodrx.com contains coupons for medications through different pharmacies. The prices here do not account   for what the cost may be with help from insurance (it may be cheaper with your insurance), but the website can give you the price if you did not use any insurance.  - You can print the associated coupon and take it with your prescription to the pharmacy.  - You may also stop by our office during regular business hours and pick up a GoodRx coupon card.  - If you need your prescription sent  electronically to a different pharmacy, notify our office through [place] MyChart or by phone at [phone] option 4.  

## 2021-01-15 ENCOUNTER — Encounter: Payer: Self-pay | Admitting: Dermatology

## 2021-04-25 ENCOUNTER — Ambulatory Visit
Admission: EM | Admit: 2021-04-25 | Discharge: 2021-04-25 | Disposition: A | Discharge status: Home / Self Care | Payer: BC Managed Care – PPO | Attending: Physician Assistant | Admitting: Physician Assistant

## 2021-04-25 ENCOUNTER — Other Ambulatory Visit: Payer: Self-pay

## 2021-04-25 DIAGNOSIS — S91301A Unspecified open wound, right foot, initial encounter: Secondary | ICD-10-CM | POA: Diagnosis not present

## 2021-04-25 DIAGNOSIS — L03115 Cellulitis of right lower limb: Secondary | ICD-10-CM

## 2021-04-25 MED ORDER — CEFTRIAXONE SODIUM 1 G IJ SOLR
0.5000 g | Freq: Once | INTRAMUSCULAR | Status: AC
  Administered 2021-04-25: 0.5 g via INTRAMUSCULAR

## 2021-04-25 MED ORDER — MUPIROCIN 2 % EX OINT: 1.0000 "application " | TOPICAL_OINTMENT | Freq: Two times a day (BID) | CUTANEOUS | 0 refills | Status: AC

## 2021-04-25 MED ORDER — CEPHALEXIN 500 MG PO CAPS: 500.0000 mg | ORAL_CAPSULE | Freq: Four times a day (QID) | ORAL | 0 refills | Status: AC

## 2021-04-25 NOTE — Discharge Instructions (Addendum)
SKIN INFECTION: I am unsure if you are bit or stung by an insect or spider but the area of the calf is infected.  Continue the doxycycline prescribed by your PCP.  I have added Keflex to this.  Take your first dose of the Keflex this evening.  Take full course.  I have also prescribed an ointment to apply to the open wound on your heel.  Clean this area with soap and water before applying ointment.  Keep a close eye on both areas and if the redness is spreading or you have increased pain or develop a fever or red streak up your leg, please contact PCP for appointment, return or go to emergency department.  This may take about 2 days to start to improve but it should not get worse.  You can have Tylenol or ibuprofen for pain.  Elevate extremity to elbow swelling.

## 2021-04-25 NOTE — ED Triage Notes (Signed)
Pt states she thought he was bitten by a mosquito to right calf on Thursday. Area has gotten more reddened, swollen, painful and itchy. PCP put him on Doxycycline yesterday and has had one dose.

## 2021-04-25 NOTE — ED Provider Notes (Signed)
[facility]    CSN: [id number] Arrival date & time: 04/25/21  0803      History   Chief Complaint Chief Complaint  Patient presents with   skin infection    HPI Cory Johnston is a 50 y.o. male presenting for concerns about red and swollen calf for the past 5 days.  Patient believes he may have been bit by mosquito or possibly by a spider but did not see any insect or spider.  He does have a small black area on the back of the calf as well.  He denies any pustular drainage.  He says the skin is hot to touch.  He has not had any fevers.  He also mentions that he has a open wound of his right posterior heel for the past 11 days.  States he was bit by a wahoo fish.  He says it looks better than it did.  Patient did contact PCP via phone and he was prescribed doxycycline.  He took a dose of that yesterday evening and then second dose this morning.  Denies any improvement or worsening of symptoms in the past 24 hours.  He is not taking anything for pain.  No history of recurrent skin infections or MRSA.  No other complaints or concerns.  HPI  Past Medical History:  Diagnosis Date   Acute diverticulitis 10/15/2017   Anxiety    Complication of anesthesia    Elevated ferritin 08/04/2015   Fatty liver    Gout    hx of   Hallux valgus of left foot    History of actinic keratosis 05/14/2016   left post auricular neck, bx proven   Hyperlipemia, mixed    Hypertension    controlled on meds   Nephrolithiasis    PONV (postoperative nausea and vomiting)    Sleep apnea    cpap    Patient Active Problem List   Diagnosis Date Noted   Diverticulitis of large intestine without perforation or abscess without bleeding    Polyp of sigmoid colon    Benign neoplasm of transverse colon    History of diverticulitis 12/08/2017   Hypercholesterolemia 10/17/2017   Hypertension 10/17/2017   Hypogonadism in male 10/17/2017   Acute diverticulitis 10/15/2017   Other specified anxiety  disorders 02/18/2017   Elevated liver enzymes 10/17/2016   Obstructive sleep apnea 08/29/2016   Elevated ferritin 08/04/2015    Past Surgical History:  Procedure Laterality Date   AIKEN OSTEOTOMY Left 02/01/2016   Procedure: Barbie Banner OSTEOTOMY AUSTIN LEFT 1ST METATARSAL;  Surgeon: Samara Deist, DPM;  Location: [place];  Service: Podiatry;  Laterality: Left;  WITH POPLITEAL CPAP   ANKLE FRACTURE SURGERY Left    COLONOSCOPY WITH PROPOFOL N/A 01/23/2018   Procedure: COLONOSCOPY WITH PROPOFOL;  Surgeon: Lucilla Lame, MD;  Location: [place];  Service: Endoscopy;  Laterality: N/A;   EXTRACORPOREAL SHOCK WAVE LITHOTRIPSY Left 10/20/2020   Procedure: EXTRACORPOREAL SHOCK WAVE LITHOTRIPSY (ESWL);  Surgeon: Billey Co, MD;  Location: [facility];  Service: Urology;  Laterality: Left;   POLYPECTOMY N/A 01/23/2018   Procedure: POLYPECTOMY;  Surgeon: Lucilla Lame, MD;  Location: [place];  Service: Endoscopy;  Laterality: N/A;   TONSILLECTOMY AND ADENOIDECTOMY     TRIGGER FINGER RELEASE     VASECTOMY         Home Medications    Prior to Admission medications   Medication Sig Start Date End Date Taking? Authorizing Provider  cephALEXin (KEFLEX) 500 MG capsule  Take 1 capsule (500 mg total) by mouth 4 (four) times daily for 7 days. 04/25/21 05/02/21 Yes Danton Clap, PA-C  mupirocin ointment (BACTROBAN) 2 % Apply 1 application topically 2 (two) times daily for 7 days. 04/25/21 05/02/21 Yes Laurene Footman B, PA-C  amLODipine (NORVASC) 5 MG tablet Take 5 mg by mouth daily.     [provider]  escitalopram (LEXAPRO) 5 MG tablet Take 2.5 mg by mouth daily. 08/30/17   [provider]  fenofibrate 160 MG tablet Take 160 mg by mouth daily. 07/16/17   [provider]  ibuprofen (ADVIL,MOTRIN) 200 MG tablet Take by mouth.    [provider]  Multiple Vitamins-Minerals (MULTIVITAMIN MEN) TABS Take 1 tablet by mouth daily.    [provider]  niacin (SLO-NIACIN) 500 MG tablet Take 500 mg by mouth at bedtime.    [provider]  Omega-3 Fatty Acids (FISH OIL) 1000 MG CAPS Take 2 capsules by mouth in the morning and at bedtime. 10/11/20 10/11/21  [provider]  psyllium (METAMUCIL) 58.6 % powder Take 1 packet by mouth 2 (two) times daily.    [provider]  testosterone cypionate (DEPOTESTOSTERONE CYPIONATE) 200 MG/ML injection Inject 1 mL into the muscle every 14 (fourteen) days. 07/16/17   [provider]    Family History Family History  Problem Relation Age of Onset   Hypertension Father    Stroke Father    CAD Father    Hyperlipidemia Father    Diabetes Father    Stroke Paternal Aunt     Social History Social History   Tobacco Use   Smoking status: Never   Smokeless tobacco: Never  Vaping Use   Vaping Use: Never used  Substance Use Topics   Alcohol use: Not Currently    Alcohol/week: 14.0 standard drinks    Types: 12 Cans of beer, 2 Shots of liquor per week    Comment: 3 x week beer and liquor   Drug use: No     Allergies   Patient has no known allergies.   Review of Systems Review of Systems  Constitutional:  Negative for fatigue and fever.  Musculoskeletal:  Negative for arthralgias, gait problem and joint swelling.       Calf swelling  Skin:  Positive for color change and wound.  Neurological:  Negative for weakness and numbness.    Physical Exam Triage Vital Signs ED Triage Vitals  Enc Vitals Group     BP 04/25/21 0814 (!) 136/94     Pulse Rate 04/25/21 0814 92     Resp 04/25/21 0814 17     Temp 04/25/21 0814 98.4 F (36.9 C)     Temp Source 04/25/21 0814 Oral     SpO2 04/25/21 0814 97 %     Weight 04/25/21 0813 235 lb (106.6 kg)     Height 04/25/21 0813 '6\' 1"'$  (1.854 m)     Head Circumference --      Peak Flow --      Pain Score 04/25/21 0813 8     Pain Loc --      Pain Edu? --      Excl. in [place]? --    No data found.  Updated  Vital Signs BP (!) 136/94 (BP Location: Left Arm)   Pulse 92   Temp 98.4 F (36.9 C) (Oral)   Resp 17   Ht '6\' 1"'$  (1.854 m)   Wt 235 lb (106.6 kg)   SpO2  97%   BMI 31.00 kg/m     Physical Exam Vitals and nursing note reviewed.  Constitutional:      General: He is not in acute distress.    Appearance: Normal appearance. He is well-developed. He is not ill-appearing.  HENT:     Head: Normocephalic and atraumatic.  Eyes:     General: No scleral icterus.    Conjunctiva/sclera: Conjunctivae normal.  Cardiovascular:     Rate and Rhythm: Normal rate and regular rhythm.     Pulses: Normal pulses.  Pulmonary:     Effort: Pulmonary effort is normal. No respiratory distress.  Musculoskeletal:     Cervical back: Neck supple.  Skin:    General: Skin is warm and dry.     Comments: RIGHT LOWER EXTREMITY: See photos below.  The majority of the right posterior calf is swollen, erythematous and warm.  Additionally he has a flat black lesion and increased erythema around this lesion.  This is particularly tender to touch.  Also he has a open wound to the right posterior calf that has some scant pustular drainage.  Area is tender to palpation.  Neurological:     General: No focal deficit present.     Mental Status: He is alert. Mental status is at baseline.     Motor: No weakness.     Gait: Gait normal.  Psychiatric:        Mood and Affect: Mood normal.        Behavior: Behavior normal.        Thought Content: Thought content normal.        UC Treatments / Results  Labs (all labs ordered are listed, but only abnormal results are displayed) Labs Reviewed - No data to display  EKG   Radiology No results found.  Procedures Procedures (including critical care time)  Medications Ordered in UC Medications  cefTRIAXone (ROCEPHIN) injection 0.5 g (0.5 g Intramuscular Given 04/25/21 0843)    Initial Impression / Assessment and Plan / UC Course  I have reviewed the triage vital  signs and the nursing notes.  Pertinent labs & imaging results that were available during my care of the patient were reviewed by me and considered in my medical decision making (see chart for details).  50 year old male presenting for cellulitis of the right posterior calf and open wound of the heel.  Calf swelling and pain have been present for 5 days and heel wound has been present for approximately 11-12 days.  Photos taken of posterior calf and wound of right heel.  He is afebrile and overall well-appearing.  Treating him at this time with 500 mg Rocephin.  Advised him to continue the doxycycline as prescribed by PCP.  Also sent in Keflex and mupirocin ointment and reviewed wound care guidelines on handout.  Advise close monitoring and follow-up with PCP regarding this infection.  ED precautions reviewed.  Final Clinical Impressions(s) / UC Diagnoses   Final diagnoses:  Cellulitis of right lower leg  Open wound of right heel, initial encounter     Discharge Instructions      SKIN INFECTION: I am unsure if you are bit or stung by an insect or spider but the area of the calf is infected.  Continue the doxycycline prescribed by your PCP.  I have added Keflex to this.  Take your first dose of the Keflex this evening.  Take full course.  I have also prescribed an ointment to apply to the open wound on your  heel.  Clean this area with soap and water before applying ointment.  Keep a close eye on both areas and if the redness is spreading or you have increased pain or develop a fever or red streak up your leg, please contact PCP for appointment, return or go to emergency department.  This may take about 2 days to start to improve but it should not get worse.  You can have Tylenol or ibuprofen for pain.  Elevate extremity to elbow swelling.     ED Prescriptions     Medication Sig Dispense Auth. Provider   cephALEXin (KEFLEX) 500 MG capsule Take 1 capsule (500 mg total) by mouth 4 (four)  times daily for 7 days. 28 capsule Laurene Footman B, PA-C   mupirocin ointment (BACTROBAN) 2 % Apply 1 application topically 2 (two) times daily for 7 days. 22 g Danton Clap, PA-C      PDMP not reviewed this encounter.   Danton Clap, PA-C 04/25/21 [phone]

## 2021-05-24 ENCOUNTER — Other Ambulatory Visit: Payer: Self-pay

## 2021-05-24 ENCOUNTER — Ambulatory Visit: Payer: BC Managed Care – PPO | Admitting: Dermatology

## 2021-05-24 DIAGNOSIS — L570 Actinic keratosis: Secondary | ICD-10-CM | POA: Diagnosis not present

## 2021-05-24 NOTE — Progress Notes (Signed)
   Follow-Up Visit   Subjective  Cory Johnston is a 50 y.o. male who presents for the following: Follow-up (Patient here today for 4 month ak follow up. He reports no new areas of concern today at follow up. ).   The following portions of the chart were reviewed this encounter and updated as appropriate:  Tobacco  Allergies  Meds  Problems  Med Hx  Surg Hx  Fam Hx      Review of Systems: No other skin or systemic complaints except as noted in HPI or Assessment and Plan.   Objective  Well appearing patient in no apparent distress; mood and affect are within normal limits.  A focused examination was performed including face, arms, hands, ears, neck. Relevant physical exam findings are noted in the Assessment and Plan.  left helix x 1, right helix x 3, right forearm x 1, right hand x 4, right post auricular neck x 3 (12) Erythematous thin papules/macules with gritty scale.   Assessment & Plan  Actinic keratosis (12) left helix x 1, right helix x 3, right forearm x 1, right hand x 4, right post auricular neck x 3  Actinic keratoses are precancerous spots that appear secondary to cumulative UV radiation exposure/sun exposure over time. They are chronic with expected duration over 1 year. A portion of actinic keratoses will progress to squamous cell carcinoma of the skin. It is not possible to reliably predict which spots will progress to skin cancer and so treatment is recommended to prevent development of skin cancer.  Recommend daily broad spectrum sunscreen SPF 30+ to sun-exposed areas, reapply every 2 hours as needed.  Recommend staying in the shade or wearing long sleeves, sun glasses (UVA+UVB protection) and wide brim hats (4-inch brim around the entire circumference of the hat). Call for new or changing lesions.  Prior to procedure, discussed risks of blister formation, small wound, skin dyspigmentation, or rare scar following cryotherapy. Recommend Vaseline ointment to treated  areas while healing.   Destruction of lesion - left helix x 1, right helix x 3, right forearm x 1, right hand x 4, right post auricular neck x 3  Destruction method: cryotherapy   Informed consent: discussed and consent obtained   Lesion destroyed using liquid nitrogen: Yes   Cryotherapy cycles:  2 Outcome: patient tolerated procedure well with no complications   Post-procedure details: wound care instructions given   Additional details:  Prior to procedure, discussed risks of blister formation, small wound, skin dyspigmentation, or rare scar following cryotherapy. Recommend Vaseline ointment to treated areas while healing.   Return for  4 - 5 month ak follow up,  march   tbse . I, Ruthell Rummage, CMA, am acting as scribe for Forest Gleason, MD.  Documentation: I have reviewed the above documentation for accuracy and completeness, and I agree with the above.  Forest Gleason, MD

## 2021-05-24 NOTE — Patient Instructions (Addendum)
Actinic keratoses are precancerous spots that appear secondary to cumulative UV radiation exposure/sun exposure over time. They are chronic with expected duration over 1 year. A portion of actinic keratoses will progress to squamous cell carcinoma of the skin. It is not possible to reliably predict which spots will progress to skin cancer and so treatment is recommended to prevent development of skin cancer.  Recommend daily broad spectrum sunscreen SPF 30+ to sun-exposed areas, reapply every 2 hours as needed.  Recommend staying in the shade or wearing long sleeves, sun glasses (UVA+UVB protection) and wide brim hats (4-inch brim around the entire circumference of the hat). Call for new or changing lesions.   Cryotherapy Aftercare  Wash gently with soap and water everyday.   Apply Vaseline and Band-Aid daily until healed.   Recommend taking Heliocare sun protection supplement daily in sunny weather for additional sun protection. For maximum protection on the sunniest days, you can take up to 2 capsules of regular Heliocare OR take 1 capsule of Heliocare Ultra. For prolonged exposure (such as a full day in the sun), you can repeat your dose of the supplement 4 hours after your first dose. Heliocare can be purchased at [facility] or at [email or web address].    If you have any questions or concerns for your doctor, please call our main line at [phone] and press option 4 to reach your doctor's medical assistant. If no one answers, please leave a voicemail as directed and we will return your call as soon as possible. Messages left after 4 pm will be answered the following business day.   You may also send [place] a message via [place]. We typically respond to MyChart messages within 1-2 business days.  For prescription refills, please ask your pharmacy to contact our office. Our fax number is [phone].  If you have an urgent issue when the clinic is closed that cannot wait until the next  business day, you can page your doctor at the number below.    Please note that while we do our best to be available for urgent issues outside of office hours, we are not available 24/7.   If you have an urgent issue and are unable to reach [place], you may choose to seek medical care at your doctor's office, retail clinic, urgent care center, or emergency room.  If you have a medical emergency, please immediately call 911 or go to the emergency department.  Pager Numbers  - Dr. Nehemiah Massed: [phone]  - Dr. Laurence Ferrari: [phone]  - Dr. Nicole Kindred: [phone]  In the event of inclement weather, please call our main line at [phone] for an update on the status of any delays or closures.  Dermatology Medication Tips: Please keep the boxes that topical medications come in in order to help keep track of the instructions about where and how to use these. Pharmacies typically print the medication instructions only on the boxes and not directly on the medication tubes.   If your medication is too expensive, please contact our office at [phone] option 4 or send [place] a message through [place].   We are unable to tell what your co-pay for medications will be in advance as this is different depending on your insurance coverage. However, we may be able to find a substitute medication at lower cost or fill out paperwork to get insurance to cover a needed medication.   If a prior authorization is required to get your medication covered by your insurance company, please allow [place] 1-2  business days to complete this process.  Drug prices often vary depending on where the prescription is filled and some pharmacies may offer cheaper prices.  The website www.goodrx.com contains coupons for medications through different pharmacies. The prices here do not account for what the cost may be with help from insurance (it may be cheaper with your insurance), but the website can give you the price if you did not use  any insurance.  - You can print the associated coupon and take it with your prescription to the pharmacy.  - You may also stop by our office during regular business hours and pick up a GoodRx coupon card.  - If you need your prescription sent electronically to a different pharmacy, notify our office through [facility] or by phone at [phone] option 4.

## 2021-05-25 ENCOUNTER — Encounter: Payer: Self-pay | Admitting: Dermatology

## 2021-05-30 ENCOUNTER — Other Ambulatory Visit: Payer: Self-pay

## 2021-05-30 ENCOUNTER — Other Ambulatory Visit: Payer: Self-pay | Admitting: Internal Medicine

## 2021-05-30 ENCOUNTER — Ambulatory Visit
Admission: RE | Admit: 2021-05-30 | Discharge: 2021-05-30 | Disposition: A | Discharge status: Home / Self Care | Payer: BC Managed Care – PPO | Source: Ambulatory Visit | Attending: Internal Medicine | Admitting: Internal Medicine

## 2021-05-30 DIAGNOSIS — M542 Cervicalgia: Secondary | ICD-10-CM | POA: Insufficient documentation

## 2021-05-30 DIAGNOSIS — R221 Localized swelling, mass and lump, neck: Secondary | ICD-10-CM | POA: Insufficient documentation

## 2021-05-30 LAB — POCT I-STAT CREATININE: Creatinine, Ser: 1.4 mg/dL — ABNORMAL HIGH (ref 0.61–1.24)

## 2021-05-30 MED ORDER — IOHEXOL 350 MG/ML SOLN
80.0000 mL | Freq: Once | INTRAVENOUS | Status: AC | PRN
  Administered 2021-05-30: 80 mL via INTRAVENOUS

## 2021-09-14 ENCOUNTER — Telehealth: Payer: Self-pay

## 2021-09-14 NOTE — Telephone Encounter (Signed)
LVM for patient to return call to office. Called to see if patient could reschedule TBSE from 3/29 to wither 2/21 or 2/28 with Dr. Jaclyn Prime

## 2021-11-22 ENCOUNTER — Ambulatory Visit: Payer: PRIVATE HEALTH INSURANCE | Admitting: Dermatology

## 2021-11-23 ENCOUNTER — Ambulatory Visit: Payer: BC Managed Care – PPO | Admitting: Dermatology

## 2021-11-23 ENCOUNTER — Encounter: Payer: Self-pay | Admitting: Dermatology

## 2021-11-23 DIAGNOSIS — L821 Other seborrheic keratosis: Secondary | ICD-10-CM | POA: Diagnosis not present

## 2021-11-23 DIAGNOSIS — L570 Actinic keratosis: Secondary | ICD-10-CM

## 2021-11-23 DIAGNOSIS — R238 Other skin changes: Secondary | ICD-10-CM

## 2021-11-23 DIAGNOSIS — Z1283 Encounter for screening for malignant neoplasm of skin: Secondary | ICD-10-CM

## 2021-11-23 DIAGNOSIS — L814 Other melanin hyperpigmentation: Secondary | ICD-10-CM

## 2021-11-23 DIAGNOSIS — D1800 Hemangioma unspecified site: Secondary | ICD-10-CM

## 2021-11-23 DIAGNOSIS — B078 Other viral warts: Secondary | ICD-10-CM

## 2021-11-23 DIAGNOSIS — D229 Melanocytic nevi, unspecified: Secondary | ICD-10-CM

## 2021-11-23 DIAGNOSIS — L578 Other skin changes due to chronic exposure to nonionizing radiation: Secondary | ICD-10-CM

## 2021-11-23 NOTE — Patient Instructions (Addendum)
Cryotherapy Aftercare ? ?Wash gently with soap and water everyday.   ?Apply Vaseline and Band-Aid daily until healed.  ? ?Prior to procedure, discussed risks of blister formation, small wound, skin dyspigmentation, or rare scar following cryotherapy. Recommend Vaseline ointment to treated areas while healing.  ? ? ?Cantharidin is a blistering agent that comes from a beetle.  It needs to be washed off in about 4 hours after application.  Although it is painless when applied in office, it may cause symptoms of mild pain and burning several hours later.  Treated areas will swell and turn red, and blisters may form.  Vaseline and a bandaid may be applied until wound has healed.  Once healed, the skin may remain temporarily discolored.  It can take weeks to months for pigmentation to return to normal.  The molluscum may resolve with this topical treatment, but often, additional treatments may be required to clear molluscum.  It is recommended to keep the skin well-moisturized and avoid scratching affected area to help prevent spread of the molluscum.  ? ? ? ?Viral Warts & Molluscum Contagiosum ? ?Viral warts and molluscum contagiosum are growths of the skin caused by viral infection of the skin. If you have been given the diagnosis of viral warts or molluscum contagiosum there are a few things that you must understand about your condition: ? ?There is no guaranteed treatment method available for this condition. ?Multiple treatments may be required, ?The treatments may be time consuming and require multiple visits to the dermatology office. ?The treatment may be expensive. You will be charged each time you come into the office to have the spots treated. ?The treated areas may develop new lesions further complicating treatment. ?The treated areas may leave a scar. ?There is no guarantee that even after multiple treatments that the spots will be successfully treated. ?These are caused by a viral infection and can be spread  to other areas of the skin and to other people by direct contact. Therefore, new spots may occur. ? ?Recommend daily broad spectrum sunscreen SPF 30+ to sun-exposed areas, reapply every 2 hours as needed. Call for new or changing lesions.  ?Staying in the shade or wearing long sleeves, sun glasses (UVA+UVB protection) and wide brim hats (4-inch brim around the entire circumference of the hat) are also recommended for sun protection.  ? ? ?If You Need Anything After Your Visit ? ?If you have any questions or concerns for your doctor, please call our main line at [phone] and press option 4 to reach your doctor's medical assistant. If no one answers, please leave a voicemail as directed and we will return your call as soon as possible. Messages left after 4 pm will be answered the following business day.  ? ?You may also send [place] a message via MyChart. We typically respond to MyChart messages within 1-2 business days. ? ?For prescription refills, please ask your pharmacy to contact our office. Our fax number is [phone]. ? ?If you have an urgent issue when the clinic is closed that cannot wait until the next business day, you can page your doctor at the number below.   ? ?Please note that while we do our best to be available for urgent issues outside of office hours, we are not available 24/7.  ? ?If you have an urgent issue and are unable to reach [place], you may choose to seek medical care at your doctor's office, retail clinic, urgent care center, or emergency room. ? ?If you have  a medical emergency, please immediately call 911 or go to the emergency department. ? ?Pager Numbers ? ?- Dr. Nehemiah Massed: [phone] ? ?- Dr. Laurence Ferrari: [phone] ? ?- Dr. Nicole Kindred: [phone] ? ?In the event of inclement weather, please call our main line at [phone] for an update on the status of any delays or closures. ? ?Dermatology Medication Tips: ?Please keep the boxes that topical medications come in in order to help keep  track of the instructions about where and how to use these. Pharmacies typically print the medication instructions only on the boxes and not directly on the medication tubes.  ? ?If your medication is too expensive, please contact our office at [phone] option 4 or send [place] a message through [place].  ? ?We are unable to tell what your co-pay for medications will be in advance as this is different depending on your insurance coverage. However, we may be able to find a substitute medication at lower cost or fill out paperwork to get insurance to cover a needed medication.  ? ?If a prior authorization is required to get your medication covered by your insurance company, please allow [place] 1-2 business days to complete this process. ? ?Drug prices often vary depending on where the prescription is filled and some pharmacies may offer cheaper prices. ? ?The website www.goodrx.com contains coupons for medications through different pharmacies. The prices here do not account for what the cost may be with help from insurance (it may be cheaper with your insurance), but the website can give you the price if you did not use any insurance.  ?- You can print the associated coupon and take it with your prescription to the pharmacy.  ?- You may also stop by our office during regular business hours and pick up a GoodRx coupon card.  ?- If you need your prescription sent electronically to a different pharmacy, notify our office through [facility] or by phone at [phone] option 4. ? ? ? ? ?Si Usted Necesita Algo Despu?s de Su Visita ? ?Tambi?n puede enviarnos un mensaje a trav?s de MyChart. Por lo general respondemos a los mensajes de MyChart en el transcurso de 1 a 2 d?as h?biles. ? ?Para renovar recetas, por favor pida a su farmacia que se ponga en contacto con nuestra oficina. Nuestro n?mero de fax es el [phone]. ? ?Si tiene un asunto urgente cuando la cl?nica est? cerrada y que no puede esperar hasta el  siguiente d?a h?bil, puede llamar/localizar a su doctor(a) al n?mero que aparece a continuaci?n.  ? ?Por favor, tenga en cuenta que aunque hacemos todo lo posible para estar disponibles para asuntos urgentes fuera del horario de oficina, no estamos disponibles las 24 horas del d?a, los 7 d?as de la semana.  ? ?Si tiene un problema urgente y no puede comunicarse con nosotros, puede optar por buscar atenci?n m?dica  en el consultorio de su doctor(a), en una cl?nica privada, en un centro de atenci?n urgente o en una sala de emergencias. ? ?Si tiene Engineer, maintenance (IT) m?dica, por favor llame inmediatamente al 911 o vaya a la sala de emergencias. ? ?N?meros de b?per ? ?- Dr. Nehemiah Massed: [phone] ? ?- Dra. Moye: [phone] ? ?- Dra. Nicole Kindred: [phone] ? ?En caso de inclemencias del tiempo, por favor llame a nuestra l?nea principal al [phone] para una actualizaci?n sobre el estado de cualquier retraso o cierre. ? ?Consejos para la medicaci?n en dermatolog?a: ?Por favor, guarde las cajas en las que vienen los medicamentos de uso t?pico para  ayudarle a seguir las instrucciones sobre d?nde y c?mo usarlos. Las farmacias generalmente imprimen las instrucciones del medicamento s?lo en las cajas y no directamente en los tubos del [place].  ? ?Si su medicamento es muy caro, por favor, p?ngase en contacto con Zigmund Daniel llamando al [phone] y presione la opci?n 4 o env?enos un mensaje a trav?s de MyChart.  ? ?No podemos decirle cu?l ser? su copago por los medicamentos por adelantado ya que esto es diferente dependiendo de la cobertura de su seguro. Sin embargo, es posible que podamos encontrar un medicamento sustituto a Electrical engineer un formulario para que el seguro cubra el medicamento que se considera necesario.  ? ?Si se requiere Ardelia Mems autorizaci?n previa para que su compa??a de seguros [place] su medicamento, por favor perm?tanos de 1 a 2 d?as h?biles para completar este proceso. ? ?Los precios de los  medicamentos var?an con frecuencia dependiendo del Environmental consultant de d?nde se surte la receta y alguna farmacias pueden ofrecer precios m?s baratos. ? ?El sitio web www.goodrx.com tiene cupones para medicamentos de difere

## 2021-11-23 NOTE — Progress Notes (Signed)
? ?Follow-Up Visit ?  ?Subjective  ?Cory Johnston is a 51 y.o. male who presents for the following: Actinic Keratosis (6 month recheck. Ears, right arm, neck, right hand. Tx with LN2) and Annual Exam (Waist up exam, per patient. Hx Aks, no personal h/o skin cancer. ). ?The patient presents for Upper Body Skin Exam (UBSE) for skin cancer screening and mole check.  The patient has spots, moles and lesions to be evaluated, some may be new or changing and the patient has concerns that these could be cancer. ? ?The following portions of the chart were reviewed this encounter and updated as appropriate:  Tobacco  Allergies  Meds  Problems  Med Hx  Surg Hx  Fam Hx   ?  ?Review of Systems: No other skin or systemic complaints except as noted in HPI or Assessment and Plan. ? ?Objective  ?Well appearing patient in no apparent distress; mood and affect are within normal limits. ? ?All skin waist up examined. ? ?Left Earlobe x1, right neck x1, left neck x1, left forearm x1 (4) ?Erythematous thin papules/macules with gritty scale.  ? ?Left Breast ?Violaceous macule ? ?Right Dorsal Mid 3rd Finger at DIP joint x1 ?Verrucous papules -- Discussed viral etiology and contagion.  ? ? ?Assessment & Plan  ? ?Lentigines ?- Scattered tan macules ?- Due to sun exposure ?- Benign-appearing, observe ?- Recommend daily broad spectrum sunscreen SPF 30+ to sun-exposed areas, reapply every 2 hours as needed. ?- Call for any changes ? ?Seborrheic Keratoses ?- Stuck-on, waxy, tan-brown papules and/or plaques  ?- Benign-appearing ?- Discussed benign etiology and prognosis. ?- Observe ?- Call for any changes ? ?Melanocytic Nevi ?- Tan-brown and/or pink-flesh-colored symmetric macules and papules ?- Benign appearing on exam today ?- Observation ?- Call clinic for new or changing moles ?- Recommend daily use of broad spectrum spf 30+ sunscreen to sun-exposed areas.  ? ?Hemangiomas ?- Red papules ?- Discussed benign nature ?- Observe ?- Call for  any changes ? ?Actinic Damage ?- Chronic condition, secondary to cumulative UV/sun exposure ?- diffuse scaly erythematous macules with underlying dyspigmentation ?- Recommend daily broad spectrum sunscreen SPF 30+ to sun-exposed areas, reapply every 2 hours as needed.  ?- Staying in the shade or wearing long sleeves, sun glasses (UVA+UVB protection) and wide brim hats (4-inch brim around the entire circumference of the hat) are also recommended for sun protection.  ?- Call for new or changing lesions. ? ?Skin cancer screening performed today. ? ?AK (actinic keratosis) (4) ?Left Earlobe x1, right neck x1, left neck x1, left forearm x1 ? ?Actinic keratoses are precancerous spots that appear secondary to cumulative UV radiation exposure/sun exposure over time. They are chronic with expected duration over 1 year. A portion of actinic keratoses will progress to squamous cell carcinoma of the skin. It is not possible to reliably predict which spots will progress to skin cancer and so treatment is recommended to prevent development of skin cancer. ? ?Recommend daily broad spectrum sunscreen SPF 30+ to sun-exposed areas, reapply every 2 hours as needed.  ?Recommend staying in the shade or wearing long sleeves, sun glasses (UVA+UVB protection) and wide brim hats (4-inch brim around the entire circumference of the hat). ?Call for new or changing lesions. ? ?Destruction of lesion - Left Earlobe x1, right neck x1, left neck x1, left forearm x1 ?Complexity: simple   ?Destruction method: cryotherapy   ?Informed consent: discussed and consent obtained   ?Timeout:  patient name, date of birth, surgical site, and  procedure verified ?Lesion destroyed using liquid nitrogen: Yes   ?Region frozen until ice ball extended beyond lesion: Yes   ?Outcome: patient tolerated procedure well with no complications   ?Post-procedure details: wound care instructions given   ? ?Venous lake ?Left chest ?Benign-appearing.  Observation.  Call clinic  for new or changing lesions.  Recommend daily use of broad spectrum spf 30+ sunscreen to sun-exposed areas.  ?Laser treatment is an option for treatment. ? ?Other viral warts ?Right Dorsal Mid 3rd Finger at DIP joint x1 ?Discussed viral etiology and risk of spread.  Discussed multiple treatments may be required to clear warts.  Discussed possible post-treatment dyspigmentation and risk of recurrence. ? ?Destruction of lesion - Right Dorsal Mid 3rd Finger at DIP joint x1 ?Complexity: simple   ?Destruction method: cryotherapy   ?Informed consent: discussed and consent obtained   ?Timeout:  patient name, date of birth, surgical site, and procedure verified ?Lesion destroyed using liquid nitrogen: Yes   ?Region frozen until ice ball extended beyond lesion: Yes   ?Outcome: patient tolerated procedure well with no complications   ?Post-procedure details: wound care instructions given   ? ?Destruction of lesion - Right Dorsal Mid 3rd Finger at DIP joint x1 ? ?Destruction method: chemical removal   ?Informed consent: discussed and consent obtained   ?Timeout:  patient name, date of birth, surgical site, and procedure verified ?Chemical destruction method: cantharidin   ?Procedure instructions: patient instructed to wash and dry area   ?Procedure instructions comment:  Wash off in 4-6 hours ?Outcome: patient tolerated procedure well with no complications   ?Post-procedure details: wound care instructions given   ? ?Return in about 6 months (around 05/26/2022) for TBSE. ? ?I, Emelia Salisbury, CMA, am acting as scribe for Sarina Ser, MD. ?Documentation: I have reviewed the above documentation for accuracy and completeness, and I agree with the above. ? ?Sarina Ser, MD ? ?

## 2021-11-24 ENCOUNTER — Encounter: Payer: Self-pay | Admitting: Dermatology

## 2021-12-20 ENCOUNTER — Other Ambulatory Visit: Payer: Self-pay | Admitting: Internal Medicine

## 2021-12-20 DIAGNOSIS — M542 Cervicalgia: Secondary | ICD-10-CM

## 2021-12-24 ENCOUNTER — Emergency Department: Payer: BC Managed Care – PPO

## 2021-12-24 ENCOUNTER — Inpatient Hospital Stay
Admission: EM | Admit: 2021-12-24 | Discharge: 2021-12-27 | DRG: 871 | Disposition: A | Discharge status: Home / Self Care | Payer: BC Managed Care – PPO | Attending: Internal Medicine | Admitting: Internal Medicine

## 2021-12-24 ENCOUNTER — Ambulatory Visit
Admission: EM | Admit: 2021-12-24 | Discharge: 2021-12-24 | Disposition: A | Discharge status: Home / Self Care | Payer: BC Managed Care – PPO | Attending: Emergency Medicine | Admitting: Emergency Medicine

## 2021-12-24 ENCOUNTER — Encounter: Payer: Self-pay | Admitting: Emergency Medicine

## 2021-12-24 ENCOUNTER — Other Ambulatory Visit: Payer: Self-pay

## 2021-12-24 DIAGNOSIS — Z22322 Carrier or suspected carrier of Methicillin resistant Staphylococcus aureus: Secondary | ICD-10-CM | POA: Diagnosis not present

## 2021-12-24 DIAGNOSIS — M109 Gout, unspecified: Secondary | ICD-10-CM | POA: Diagnosis present

## 2021-12-24 DIAGNOSIS — F419 Anxiety disorder, unspecified: Secondary | ICD-10-CM | POA: Diagnosis present

## 2021-12-24 DIAGNOSIS — I10 Essential (primary) hypertension: Secondary | ICD-10-CM | POA: Diagnosis present

## 2021-12-24 DIAGNOSIS — J9601 Acute respiratory failure with hypoxia: Secondary | ICD-10-CM | POA: Diagnosis present

## 2021-12-24 DIAGNOSIS — A419 Sepsis, unspecified organism: Secondary | ICD-10-CM | POA: Diagnosis not present

## 2021-12-24 DIAGNOSIS — Z8249 Family history of ischemic heart disease and other diseases of the circulatory system: Secondary | ICD-10-CM | POA: Diagnosis not present

## 2021-12-24 DIAGNOSIS — Z79899 Other long term (current) drug therapy: Secondary | ICD-10-CM

## 2021-12-24 DIAGNOSIS — J810 Acute pulmonary edema: Secondary | ICD-10-CM | POA: Diagnosis not present

## 2021-12-24 DIAGNOSIS — I130 Hypertensive heart and chronic kidney disease with heart failure and stage 1 through stage 4 chronic kidney disease, or unspecified chronic kidney disease: Secondary | ICD-10-CM | POA: Diagnosis present

## 2021-12-24 DIAGNOSIS — E1165 Type 2 diabetes mellitus with hyperglycemia: Secondary | ICD-10-CM | POA: Diagnosis present

## 2021-12-24 DIAGNOSIS — N492 Inflammatory disorders of scrotum: Principal | ICD-10-CM | POA: Diagnosis present

## 2021-12-24 DIAGNOSIS — L0291 Cutaneous abscess, unspecified: Secondary | ICD-10-CM

## 2021-12-24 DIAGNOSIS — E669 Obesity, unspecified: Secondary | ICD-10-CM | POA: Diagnosis present

## 2021-12-24 DIAGNOSIS — N179 Acute kidney failure, unspecified: Secondary | ICD-10-CM | POA: Diagnosis present

## 2021-12-24 DIAGNOSIS — E782 Mixed hyperlipidemia: Secondary | ICD-10-CM | POA: Diagnosis present

## 2021-12-24 DIAGNOSIS — E1122 Type 2 diabetes mellitus with diabetic chronic kidney disease: Secondary | ICD-10-CM | POA: Diagnosis present

## 2021-12-24 DIAGNOSIS — Z6831 Body mass index (BMI) 31.0-31.9, adult: Secondary | ICD-10-CM | POA: Diagnosis not present

## 2021-12-24 DIAGNOSIS — K760 Fatty (change of) liver, not elsewhere classified: Secondary | ICD-10-CM | POA: Diagnosis present

## 2021-12-24 DIAGNOSIS — A4101 Sepsis due to Methicillin susceptible Staphylococcus aureus: Secondary | ICD-10-CM | POA: Diagnosis not present

## 2021-12-24 DIAGNOSIS — F32A Depression, unspecified: Secondary | ICD-10-CM | POA: Diagnosis present

## 2021-12-24 DIAGNOSIS — Z20822 Contact with and (suspected) exposure to covid-19: Secondary | ICD-10-CM | POA: Diagnosis present

## 2021-12-24 DIAGNOSIS — L03818 Cellulitis of other sites: Secondary | ICD-10-CM | POA: Diagnosis not present

## 2021-12-24 DIAGNOSIS — R6520 Severe sepsis without septic shock: Secondary | ICD-10-CM | POA: Diagnosis not present

## 2021-12-24 DIAGNOSIS — A4902 Methicillin resistant Staphylococcus aureus infection, unspecified site: Secondary | ICD-10-CM | POA: Diagnosis not present

## 2021-12-24 DIAGNOSIS — I248 Other forms of acute ischemic heart disease: Secondary | ICD-10-CM | POA: Diagnosis present

## 2021-12-24 DIAGNOSIS — N1831 Chronic kidney disease, stage 3a: Secondary | ICD-10-CM | POA: Diagnosis present

## 2021-12-24 DIAGNOSIS — G4733 Obstructive sleep apnea (adult) (pediatric): Secondary | ICD-10-CM | POA: Diagnosis present

## 2021-12-24 DIAGNOSIS — I5031 Acute diastolic (congestive) heart failure: Secondary | ICD-10-CM | POA: Diagnosis not present

## 2021-12-24 DIAGNOSIS — I5032 Chronic diastolic (congestive) heart failure: Secondary | ICD-10-CM | POA: Diagnosis present

## 2021-12-24 DIAGNOSIS — R7303 Prediabetes: Secondary | ICD-10-CM | POA: Diagnosis present

## 2021-12-24 DIAGNOSIS — R739 Hyperglycemia, unspecified: Secondary | ICD-10-CM | POA: Diagnosis not present

## 2021-12-24 DIAGNOSIS — Z833 Family history of diabetes mellitus: Secondary | ICD-10-CM

## 2021-12-24 LAB — CBC WITH DIFFERENTIAL/PLATELET
Abs Immature Granulocytes: 0.08 10*3/uL — ABNORMAL HIGH (ref 0.00–0.07)
Basophils Absolute: 0 10*3/uL (ref 0.0–0.1)
Basophils Relative: 0 %
Eosinophils Absolute: 0.2 10*3/uL (ref 0.0–0.5)
Eosinophils Relative: 2 %
HCT: 45.4 % (ref 39.0–52.0)
Hemoglobin: 15.4 g/dL (ref 13.0–17.0)
Immature Granulocytes: 1 %
Lymphocytes Relative: 7 %
Lymphs Abs: 0.8 10*3/uL (ref 0.7–4.0)
MCH: 29.1 pg (ref 26.0–34.0)
MCHC: 33.9 g/dL (ref 30.0–36.0)
MCV: 85.7 fL (ref 80.0–100.0)
Monocytes Absolute: 0.9 10*3/uL (ref 0.1–1.0)
Monocytes Relative: 7 %
Neutro Abs: 10.8 10*3/uL — ABNORMAL HIGH (ref 1.7–7.7)
Neutrophils Relative %: 83 %
Platelets: 176 10*3/uL (ref 150–400)
RBC: 5.3 MIL/uL (ref 4.22–5.81)
RDW: 13.4 % (ref 11.5–15.5)
WBC: 12.8 10*3/uL — ABNORMAL HIGH (ref 4.0–10.5)
nRBC: 0 % (ref 0.0–0.2)

## 2021-12-24 LAB — COMPREHENSIVE METABOLIC PANEL
ALT: 29 U/L (ref 0–44)
AST: 23 U/L (ref 15–41)
Albumin: 3.7 g/dL (ref 3.5–5.0)
Alkaline Phosphatase: 36 U/L — ABNORMAL LOW (ref 38–126)
Anion gap: 8 (ref 5–15)
BUN: 20 mg/dL (ref 6–20)
CO2: 22 mmol/L (ref 22–32)
Calcium: 8.9 mg/dL (ref 8.9–10.3)
Chloride: 106 mmol/L (ref 98–111)
Creatinine, Ser: 1.47 mg/dL — ABNORMAL HIGH (ref 0.61–1.24)
GFR, Estimated: 57 mL/min — ABNORMAL LOW (ref >60)
Glucose, Bld: 123 mg/dL — ABNORMAL HIGH (ref 70–99)
Potassium: 4.2 mmol/L (ref 3.5–5.1)
Sodium: 136 mmol/L (ref 135–145)
Total Bilirubin: 1.3 mg/dL — ABNORMAL HIGH (ref 0.3–1.2)
Total Protein: 6.6 g/dL (ref 6.5–8.1)

## 2021-12-24 LAB — URINALYSIS, COMPLETE (UACMP) WITH MICROSCOPIC
Bacteria, UA: NONE SEEN
Bilirubin Urine: NEGATIVE
Glucose, UA: NEGATIVE mg/dL
Hgb urine dipstick: NEGATIVE
Ketones, ur: NEGATIVE mg/dL
Leukocytes,Ua: NEGATIVE
Nitrite: NEGATIVE
Protein, ur: NEGATIVE mg/dL
Specific Gravity, Urine: 1.039 — ABNORMAL HIGH (ref 1.005–1.030)
Squamous Epithelial / HPF: NONE SEEN (ref 0–5)
pH: 7 (ref 5.0–8.0)

## 2021-12-24 LAB — RESP PANEL BY RT-PCR (FLU A&B, COVID) ARPGX2
Influenza A by PCR: NEGATIVE
Influenza B by PCR: NEGATIVE
SARS Coronavirus 2 by RT PCR: NEGATIVE

## 2021-12-24 LAB — CBC
HCT: 41.3 % (ref 39.0–52.0)
Hemoglobin: 13.9 g/dL (ref 13.0–17.0)
MCH: 29.6 pg (ref 26.0–34.0)
MCHC: 33.7 g/dL (ref 30.0–36.0)
MCV: 87.9 fL (ref 80.0–100.0)
Platelets: 148 10*3/uL — ABNORMAL LOW (ref 150–400)
RBC: 4.7 MIL/uL (ref 4.22–5.81)
RDW: 13.4 % (ref 11.5–15.5)
WBC: 11.6 10*3/uL — ABNORMAL HIGH (ref 4.0–10.5)
nRBC: 0 % (ref 0.0–0.2)

## 2021-12-24 LAB — LACTIC ACID, PLASMA
Lactic Acid, Venous: 1.6 mmol/L (ref 0.5–1.9)
Lactic Acid, Venous: 1 mmol/L (ref 0.5–1.9)

## 2021-12-24 LAB — TROPONIN I (HIGH SENSITIVITY): Troponin I (High Sensitivity): 12 ng/L (ref <18)

## 2021-12-24 LAB — APTT: aPTT: 34 seconds (ref 24–36)

## 2021-12-24 LAB — CREATININE, SERUM
Creatinine, Ser: 1.57 mg/dL — ABNORMAL HIGH (ref 0.61–1.24)
GFR, Estimated: 53 mL/min — ABNORMAL LOW (ref >60)

## 2021-12-24 LAB — GLUCOSE, CAPILLARY: Glucose-Capillary: 137 mg/dL — ABNORMAL HIGH (ref 70–99)

## 2021-12-24 LAB — PROTIME-INR
INR: 1.1 (ref 0.8–1.2)
Prothrombin Time: 14.5 seconds (ref 11.4–15.2)

## 2021-12-24 MED ORDER — ENOXAPARIN SODIUM 60 MG/0.6ML IJ SOSY
0.5000 mg/kg | PREFILLED_SYRINGE | INTRAMUSCULAR | Status: DC
  Administered 2021-12-24 – 2021-12-26 (×3): 55 mg via SUBCUTANEOUS
  Filled 2021-12-24: qty 0.6
  Filled 2021-12-24: qty 0.55
  Filled 2021-12-24: qty 0.6

## 2021-12-24 MED ORDER — ONDANSETRON HCL 4 MG/2ML IJ SOLN
INTRAMUSCULAR | Status: AC
  Filled 2021-12-24: qty 2

## 2021-12-24 MED ORDER — ACETAMINOPHEN 325 MG PO TABS
650.0000 mg | ORAL_TABLET | Freq: Four times a day (QID) | ORAL | Status: DC | PRN
  Administered 2021-12-24 – 2021-12-26 (×5): 650 mg via ORAL
  Filled 2021-12-24 (×5): qty 2

## 2021-12-24 MED ORDER — CEFTRIAXONE SODIUM 1 G IJ SOLR
1.0000 g | Freq: Once | INTRAMUSCULAR | Status: AC
  Administered 2021-12-24: 1 g via INTRAMUSCULAR

## 2021-12-24 MED ORDER — ACETAMINOPHEN 10 MG/ML IV SOLN
1000.0000 mg | Freq: Four times a day (QID) | INTRAVENOUS | Status: DC
  Administered 2021-12-24: 1000 mg via INTRAVENOUS
  Filled 2021-12-24 (×3): qty 100

## 2021-12-24 MED ORDER — MORPHINE SULFATE (PF) 2 MG/ML IV SOLN
2.0000 mg | INTRAVENOUS | Status: DC | PRN
  Administered 2021-12-24 – 2021-12-27 (×6): 2 mg via INTRAVENOUS
  Filled 2021-12-24 (×6): qty 1

## 2021-12-24 MED ORDER — ESCITALOPRAM OXALATE 10 MG PO TABS
5.0000 mg | ORAL_TABLET | Freq: Every day | ORAL | Status: DC
  Administered 2021-12-25 – 2021-12-27 (×3): 5 mg via ORAL
  Filled 2021-12-24: qty 0.5
  Filled 2021-12-24 (×2): qty 1

## 2021-12-24 MED ORDER — HYDROCODONE-ACETAMINOPHEN 5-325 MG PO TABS: 2.0000 | ORAL_TABLET | Freq: Four times a day (QID) | ORAL | 0 refills | Status: DC | PRN

## 2021-12-24 MED ORDER — VANCOMYCIN HCL 2000 MG/400ML IV SOLN
2000.0000 mg | Freq: Once | INTRAVENOUS | Status: AC
  Administered 2021-12-24: 2000 mg via INTRAVENOUS
  Filled 2021-12-24: qty 400

## 2021-12-24 MED ORDER — AMOXICILLIN-POT CLAVULANATE 875-125 MG PO TABS: 1.0000 | ORAL_TABLET | Freq: Two times a day (BID) | ORAL | 0 refills | Status: DC

## 2021-12-24 MED ORDER — PIPERACILLIN-TAZOBACTAM 3.375 G IVPB 30 MIN
3.3750 g | Freq: Once | INTRAVENOUS | Status: AC
  Administered 2021-12-24: 3.375 g via INTRAVENOUS
  Filled 2021-12-24: qty 50

## 2021-12-24 MED ORDER — ACETAMINOPHEN 650 MG RE SUPP: 650.0000 mg | Freq: Four times a day (QID) | RECTAL | Status: DC | PRN

## 2021-12-24 MED ORDER — MORPHINE SULFATE (PF) 4 MG/ML IV SOLN
6.0000 mg | Freq: Once | INTRAVENOUS | Status: AC
  Administered 2021-12-24: 6 mg via INTRAVENOUS
  Filled 2021-12-24: qty 2

## 2021-12-24 MED ORDER — DOXYCYCLINE HYCLATE 100 MG PO CAPS: 100.0000 mg | ORAL_CAPSULE | Freq: Two times a day (BID) | ORAL | 0 refills | Status: DC

## 2021-12-24 MED ORDER — LACTATED RINGERS IV BOLUS
2000.0000 mL | Freq: Once | INTRAVENOUS | Status: AC
  Administered 2021-12-24: 2000 mL via INTRAVENOUS

## 2021-12-24 MED ORDER — ONDANSETRON HCL 4 MG/2ML IJ SOLN
4.0000 mg | Freq: Once | INTRAMUSCULAR | Status: AC
Start: 2021-12-24 — End: 2021-12-24
  Administered 2021-12-24: 4 mg via INTRAVENOUS

## 2021-12-24 MED ORDER — INSULIN ASPART 100 UNIT/ML IJ SOLN
0.0000 [IU] | Freq: Three times a day (TID) | INTRAMUSCULAR | Status: DC
  Administered 2021-12-25 (×2): 2 [IU] via SUBCUTANEOUS
  Administered 2021-12-26 (×2): 3 [IU] via SUBCUTANEOUS
  Administered 2021-12-26: 2 [IU] via SUBCUTANEOUS
  Filled 2021-12-24 (×5): qty 1

## 2021-12-24 MED ORDER — POLYETHYLENE GLYCOL 3350 17 G PO PACK: 17.0000 g | PACK | Freq: Every day | ORAL | Status: DC | PRN

## 2021-12-24 MED ORDER — ESCITALOPRAM OXALATE 5 MG PO TABS: 2.5000 mg | ORAL_TABLET | Freq: Every day | ORAL | Status: DC

## 2021-12-24 MED ORDER — SODIUM CHLORIDE 0.9 % IV BOLUS
1000.0000 mL | Freq: Once | INTRAVENOUS | Status: AC
  Administered 2021-12-24: 1000 mL via INTRAVENOUS

## 2021-12-24 MED ORDER — IOHEXOL 300 MG/ML  SOLN
80.0000 mL | Freq: Once | INTRAMUSCULAR | Status: AC | PRN
  Administered 2021-12-24: 80 mL via INTRAVENOUS
  Filled 2021-12-24: qty 80

## 2021-12-24 MED ORDER — PIPERACILLIN-TAZOBACTAM 3.375 G IVPB
3.3750 g | Freq: Three times a day (TID) | INTRAVENOUS | Status: AC
  Administered 2021-12-24 – 2021-12-26 (×7): 3.375 g via INTRAVENOUS
  Filled 2021-12-24 (×7): qty 50

## 2021-12-24 MED ORDER — ONDANSETRON 4 MG PO TBDP
4.0000 mg | ORAL_TABLET | Freq: Three times a day (TID) | ORAL | 0 refills | Status: AC | PRN
Start: 2021-12-24 — End: 2021-12-27

## 2021-12-24 MED ORDER — LACTATED RINGERS IV BOLUS
1000.0000 mL | Freq: Once | INTRAVENOUS | Status: AC
  Administered 2021-12-24: 1000 mL via INTRAVENOUS

## 2021-12-24 MED ORDER — SODIUM CHLORIDE 0.9 % IV SOLN: Freq: Once | INTRAVENOUS | Status: AC

## 2021-12-24 MED ORDER — KETOROLAC TROMETHAMINE 30 MG/ML IJ SOLN
15.0000 mg | Freq: Once | INTRAMUSCULAR | Status: AC
  Administered 2021-12-24: 15 mg via INTRAVENOUS
  Filled 2021-12-24: qty 1

## 2021-12-24 MED ORDER — BISACODYL 5 MG PO TBEC: 5.0000 mg | DELAYED_RELEASE_TABLET | Freq: Every day | ORAL | Status: DC | PRN

## 2021-12-24 MED ORDER — LACTATED RINGERS IV SOLN: INTRAVENOUS | Status: DC

## 2021-12-24 NOTE — ED Triage Notes (Signed)
Pt here with an abscess on his scrotum. Pt was sent from UC. Pt states severe pain and fevers at home.  ?

## 2021-12-24 NOTE — H&P (Signed)
? ? ?History and Physical:  ? ? ?Cory Johnston  ? ?MWN:[id number] DOB: 1971-01-20 DOA: 12/24/2021 ? ?Referring MD/provider: Dr. Tamala Julian ?PCP: Adin Hector, MD  ? ?Patient coming from: Home ? ?Chief Complaint: Scrotal abscess ? ?History of Present Illness:  ? ?Cory Johnston is an 51 y.o. male 51 year old man who was diagnosed with diabetes with hemoglobin A1c 6.4 and started on Ozempic last week, also has hypertension was in [facility] until 3 days ago when he noted "a small bump down there like a chigger bite".  He noted that that area became swollen and red and tender.  Also noted some fevers and chills yesterday.  His girlfriend is an Therapist, sports and she had him come in today. ? ?In the ED patient was noted to be febrile and hypotensive and to have a scrotal abscess.  Patient was started on sepsis protocol with aggressive fluid resuscitation and initiation of vancomycin and Zosyn.  CT scan showed no deep infection.  He underwent I&D in the ED with 1 mL of purulent fluid and cavity was packed. Follow-up scrotal ultrasound showed no underlying abscess or gas.  Patient was seen by urology who agreed with treatment and recommended continuing IV antibiotics. ? ?At present patient is not sure if he feels better.  Notes that morphine has been effective with pain management as well as Toradol.  ? ?ROS:  ? ?ROS  ? ?Review of Systems: ?Per HPI ? ?Past Medical History:  ? ?Past Medical History:  ?Diagnosis Date  ? Acute diverticulitis 10/15/2017  ? Anxiety   ? Complication of anesthesia   ? Elevated ferritin 08/04/2015  ? Fatty liver   ? Gout   ? hx of  ? Hallux valgus of left foot   ? History of actinic keratosis 05/14/2016  ? left post auricular neck, bx proven  ? Hyperlipemia, mixed   ? Hypertension   ? controlled on meds  ? Nephrolithiasis   ? PONV (postoperative nausea and vomiting)   ? Sleep apnea   ? cpap  ? ? ?Past Surgical History:  ? ?Past Surgical History:  ?Procedure Laterality Date  ? AIKEN OSTEOTOMY Left 02/01/2016  ?  Procedure: Barbie Banner OSTEOTOMY AUSTIN LEFT 1ST METATARSAL;  Surgeon: Samara Deist, DPM;  Location: [place];  Service: Podiatry;  Laterality: Left;  WITH POPLITEAL ?CPAP  ? ANKLE FRACTURE SURGERY Left   ? COLONOSCOPY WITH PROPOFOL N/A 01/23/2018  ? Procedure: COLONOSCOPY WITH PROPOFOL;  Surgeon: Lucilla Lame, MD;  Location: [place];  Service: Endoscopy;  Laterality: N/A;  ? EXTRACORPOREAL SHOCK WAVE LITHOTRIPSY Left 10/20/2020  ? Procedure: EXTRACORPOREAL SHOCK WAVE LITHOTRIPSY (ESWL);  Surgeon: Billey Co, MD;  Location: [facility];  Service: Urology;  Laterality: Left;  ? POLYPECTOMY N/A 01/23/2018  ? Procedure: POLYPECTOMY;  Surgeon: Lucilla Lame, MD;  Location: [place];  Service: Endoscopy;  Laterality: N/A;  ? TONSILLECTOMY AND ADENOIDECTOMY    ? TRIGGER FINGER RELEASE    ? VASECTOMY    ? ? ?Social History:  ? ?Social History  ? ?Socioeconomic History  ? Marital status: Divorced  ?  Spouse name: Not on file  ? Number of children: Not on file  ? Years of education: Not on file  ? Highest education level: Not on file  ?Occupational History  ? Not on file  ?Tobacco Use  ? Smoking status: Never  ? Smokeless tobacco: Never  ?Vaping Use  ? Vaping Use: Never used  ?Substance and Sexual Activity  ?  Alcohol use: Not Currently  ?  Alcohol/week: 14.0 standard drinks  ?  Types: 12 Cans of beer, 2 Shots of liquor per week  ?  Comment: 3 x week beer and liquor  ? Drug use: No  ? Sexual activity: Not on file  ?Other Topics Concern  ? Not on file  ?Social History Narrative  ? Not on file  ? ?Social Determinants of Health  ? ?Financial Resource Strain: Not on file  ?Food Insecurity: Not on file  ?Transportation Needs: Not on file  ?Physical Activity: Not on file  ?Stress: Not on file  ?Social Connections: Not on file  ?Intimate Partner Violence: Not on file  ? ? ?Allergies  ? ?Patient has no known allergies. ? ?Family history:  ? ?Family History  ?Problem Relation Age of Onset  ? Hypertension  Father   ? Stroke Father   ? CAD Father   ? Hyperlipidemia Father   ? Diabetes Father   ? Stroke Paternal Aunt   ? ? ?Current Medications:  ? ?Prior to Admission medications   ?Medication Sig Start Date End Date Taking? Authorizing Provider  ?amLODipine (NORVASC) 5 MG tablet Take 5 mg by mouth daily.     [provider]  ?amoxicillin-clavulanate (AUGMENTIN) 875-125 MG tablet Take 1 tablet by mouth 2 (two) times daily for 7 days. 12/24/21 12/31/21  Lannie Fields, PA-C  ?doxycycline (VIBRAMYCIN) 100 MG capsule Take 1 capsule (100 mg total) by mouth 2 (two) times daily. 12/24/21   Lannie Fields, PA-C  ?escitalopram (LEXAPRO) 5 MG tablet Take 2.5 mg by mouth daily. 08/30/17   [provider]  ?fenofibrate 160 MG tablet Take 160 mg by mouth daily. 07/16/17   [provider]  ?HYDROcodone-acetaminophen (NORCO/VICODIN) 5-325 MG tablet Take 2 tablets by mouth every 6 (six) hours as needed for up to 2 days. 12/24/21 12/26/21  Lannie Fields, PA-C  ?ibuprofen (ADVIL,MOTRIN) 200 MG tablet Take by mouth.    [provider]  ?Multiple Vitamins-Minerals (MULTIVITAMIN MEN) TABS Take 1 tablet by mouth daily.    [provider]  ?niacin (SLO-NIACIN) 500 MG tablet Take 500 mg by mouth at bedtime.    [provider]  ?ondansetron (ZOFRAN-ODT) 4 MG disintegrating tablet Take 1 tablet (4 mg total) by mouth every 8 (eight) hours as needed for up to 3 days for nausea or vomiting. 12/24/21 12/27/21  Lannie Fields, PA-C  ?psyllium (METAMUCIL) 58.6 % powder Take 1 packet by mouth 2 (two) times daily.    [provider]  ?testosterone cypionate (DEPOTESTOSTERONE CYPIONATE) 200 MG/ML injection Inject 1 mL into the muscle every 14 (fourteen) days. 07/16/17   [provider]  ? ? ?Physical Exam:  ? ?Vitals:  ? 12/24/21 1318 12/24/21 1337 12/24/21 1430 12/24/21 1500  ?BP:  109/68 108/69 99/60  ?Pulse:  80 74 71  ?Resp:  '20 13 16  '$ ?Temp: 99.7 ?F (37.6 ?C)     ?TempSrc: Oral      ?SpO2: 96% 96%    ?Weight:      ?Height:      ? ? ? ?Physical Exam: ?Blood pressure 99/60, pulse 71, temperature 99.7 ?F (37.6 ?C), temperature source Oral, resp. rate 16, height '6\' 1"'$  (1.854 m), weight 108.9 kg, SpO2 96 %. ?Gen: Somewhat ruddy complected, obese, tired appearing man lying in bed with attentive girlfriend at bedside ?Eyes: sclera anicteric, conjuctiva mildly injected bilaterally ?CVS: S1-S2, regulary, no gallops ?Respiratory: CTA ?GI: NABS, firm but not hard, obese  NT  ?LE: No edema. No cyanosis ?Neuro: A/O x 3, grossly nonfocal.  ?Psych: mood and affect appropriate to situation. ?Skin: Scrotal abscess is packed, not examined by me, defer to urology who examined patient earlier today ? ? ?Data Review:  ? ? ?Labs: ?Basic Metabolic Panel: ?Recent Labs  ?Lab 12/24/21 ?1245  ?NA 136  ?K 4.2  ?CL 106  ?CO2 22  ?GLUCOSE 123*  ?BUN 20  ?CREATININE 1.47*  ?CALCIUM 8.9  ? ?Liver Function Tests: ?Recent Labs  ?Lab 12/24/21 ?1245  ?AST 23  ?ALT 29  ?ALKPHOS 36*  ?BILITOT 1.3*  ?PROT 6.6  ?ALBUMIN 3.7  ? ?No results for input(s): LIPASE, AMYLASE in the last 168 hours. ?No results for input(s): AMMONIA in the last 168 hours. ?CBC: ?Recent Labs  ?Lab 12/24/21 ?1245  ?WBC 12.8*  ?NEUTROABS 10.8*  ?HGB 15.4  ?HCT 45.4  ?MCV 85.7  ?PLT 176  ? ?Cardiac Enzymes: ?No results for input(s): CKTOTAL, CKMB, CKMBINDEX, TROPONINI in the last 168 hours. ? ?BNP (last 3 results) ?No results for input(s): PROBNP in the last 8760 hours. ?CBG: ?No results for input(s): GLUCAP in the last 168 hours. ? ?Urinalysis ?   ?Component Value Date/Time  ? COLORURINE YELLOW (A) 12/24/2021 1530  ? APPEARANCEUR CLEAR (A) 12/24/2021 1530  ? APPEARANCEUR Clear 10/13/2020 0907  ? LABSPEC 1.039 (H) 12/24/2021 1530  ? PHURINE 7.0 12/24/2021 1530  ? GLUCOSEU NEGATIVE 12/24/2021 1530  ? [place] NEGATIVE 12/24/2021 1530  ? [place] NEGATIVE 12/24/2021 1530  ? BILIRUBINUR Negative 10/13/2020 0907  ? [place] Stain NEGATIVE 12/24/2021 1530  ? [place]  NEGATIVE 12/24/2021 1530  ? NITRITE NEGATIVE 12/24/2021 1530  ? LEUKOCYTESUR NEGATIVE 12/24/2021 1530  ? ? ? ? ?Radiographic Studies: ?CT PELVIS W CONTRAST ? ?Result Date: 12/24/2021 ?CLINICAL DATA:  51 year old male wit

## 2021-12-24 NOTE — Consult Note (Signed)
Urology Consult  ? ?Physician requesting consult: Vashti Hey, MD ? ?Reason for consult: Scrotal abscess and cellulitis ? ?History of Present Illness: Cory Johnston is a 51 y.o. who presented to an urgent care earlier today with complaints of scrotal enlargement, swelling and pain over the past 4 days.  He reported feeling feverish and chills at home.  At the urgent care, he underwent an incision and drainage of a scrotal abscess with a very small incision.  Reportedly 1 mL purulent fluid was returned.  He was then sent to the emergency department to obtain scrotal ultrasound to ensure no deeper abscess.  Scrotal ultrasound 12/24/2021 with scrotal edema however no underlying abscess apparent.  Follow-up CT pelvis 12/24/2021 with inflammation and cellulitis in the posterior/inferior scrotum.  There is no definite abscess in this location.  The prior packing was identified.  There is also no evidence of subcutaneous gas. ?Patient does complain of scrotal pain.  He is complaining intermittent fevers.  He does state he has a history of abscesses in his axilla and abdomen however denies prior history of scrotal or perineal or perirectal abscesses. ?Past Medical History:  ?Diagnosis Date  ? Acute diverticulitis 10/15/2017  ? Anxiety   ? Complication of anesthesia   ? Elevated ferritin 08/04/2015  ? Fatty liver   ? Gout   ? hx of  ? Hallux valgus of left foot   ? History of actinic keratosis 05/14/2016  ? left post auricular neck, bx proven  ? Hyperlipemia, mixed   ? Hypertension   ? controlled on meds  ? Nephrolithiasis   ? PONV (postoperative nausea and vomiting)   ? Sleep apnea   ? cpap  ? ? ?Past Surgical History:  ?Procedure Laterality Date  ? AIKEN OSTEOTOMY Left 02/01/2016  ? Procedure: Barbie Banner OSTEOTOMY [place] LEFT 1ST METATARSAL;  Surgeon: Samara Deist, DPM;  Location: [place];  Service: Podiatry;  Laterality: Left;  WITH POPLITEAL ?CPAP  ? ANKLE FRACTURE SURGERY Left   ? COLONOSCOPY WITH  PROPOFOL N/A 01/23/2018  ? Procedure: COLONOSCOPY WITH PROPOFOL;  Surgeon: Lucilla Lame, MD;  Location: [place];  Service: Endoscopy;  Laterality: N/A;  ? EXTRACORPOREAL SHOCK WAVE LITHOTRIPSY Left 10/20/2020  ? Procedure: EXTRACORPOREAL SHOCK WAVE LITHOTRIPSY (ESWL);  Surgeon: Billey Co, MD;  Location: [facility];  Service: Urology;  Laterality: Left;  ? POLYPECTOMY N/A 01/23/2018  ? Procedure: POLYPECTOMY;  Surgeon: Lucilla Lame, MD;  Location: [place];  Service: Endoscopy;  Laterality: N/A;  ? TONSILLECTOMY AND ADENOIDECTOMY    ? TRIGGER FINGER RELEASE    ? VASECTOMY    ? ? ?Medications: ? ?Home meds:  ?No current facility-administered medications on file prior to encounter.  ? ?Current Outpatient Medications on File Prior to Encounter  ?Medication Sig Dispense Refill  ? amLODipine (NORVASC) 5 MG tablet Take 5 mg by mouth daily.     ? amoxicillin-clavulanate (AUGMENTIN) 875-125 MG tablet Take 1 tablet by mouth 2 (two) times daily for 7 days. 14 tablet 0  ? doxycycline (VIBRAMYCIN) 100 MG capsule Take 1 capsule (100 mg total) by mouth 2 (two) times daily. 20 capsule 0  ? escitalopram (LEXAPRO) 5 MG tablet Take 2.5 mg by mouth daily.  4  ? fenofibrate 160 MG tablet Take 160 mg by mouth daily.  4  ? HYDROcodone-acetaminophen (NORCO/VICODIN) 5-325 MG tablet Take 2 tablets by mouth every 6 (six) hours as needed for up to 2 days. 10 tablet 0  ? ibuprofen (ADVIL,MOTRIN) 200  MG tablet Take by mouth.    ? Multiple Vitamins-Minerals (MULTIVITAMIN MEN) TABS Take 1 tablet by mouth daily.    ? niacin (SLO-NIACIN) 500 MG tablet Take 500 mg by mouth at bedtime.    ? ondansetron (ZOFRAN-ODT) 4 MG disintegrating tablet Take 1 tablet (4 mg total) by mouth every 8 (eight) hours as needed for up to 3 days for nausea or vomiting. 10 tablet 0  ? psyllium (METAMUCIL) 58.6 % powder Take 1 packet by mouth 2 (two) times daily.    ? testosterone cypionate (DEPOTESTOSTERONE CYPIONATE) 200 MG/ML injection Inject 1  mL into the muscle every 14 (fourteen) days.  1  ? ? ? ?Scheduled Meds: ? ondansetron      ? ?Continuous Infusions: ? acetaminophen Stopped (12/24/21 1300)  ? ?PRN Meds:. ? ?Allergies: No Known Allergies ? ?Family History  ?Problem Relation Age of Onset  ? Hypertension Father   ? Stroke Father   ? CAD Father   ? Hyperlipidemia Father   ? Diabetes Father   ? Stroke Paternal Aunt   ? ? ?Social History:  reports that he has never smoked. He has never used smokeless tobacco. He reports that he does not currently use alcohol after a past usage of about 14.0 standard drinks per week. He reports that he does not use drugs. ? ?ROS: ?A complete review of systems was performed.  All systems are negative except for pertinent findings as noted. ? ?Physical Exam:  ?Vital signs in last 24 hours: ?Temp:  [99.7 ?F (37.6 ?C)-102.4 ?F (39.1 ?C)] 99.7 ?F (37.6 ?C) (04/30 1318) ?Pulse Rate:  [71-91] 71 (04/30 1500) ?Resp:  [12-20] 16 (04/30 1500) ?BP: (99-140)/(60-83) 99/60 (04/30 1500) ?SpO2:  [91 %-97 %] 96 % (04/30 1337) ?Weight:  [108.9 kg] 108.9 kg (04/30 1113) ?Constitutional:  Alert and oriented, No acute distress ?Cardiovascular: Regular rate and rhythm ?Respiratory: Normal respiratory effort, Lungs clear bilaterally ?GI: Abdomen is soft, nontender, nondistended, no abdominal masses ?Genitourinary: No CVAT. Normal male phallus ?Scrotum is edematous and erythematous in the dependent portion.  In the posterior inferior aspect in his perineum, there is a small stab incision identified with no drainage.  I removed his dressing.  I probed the area with a hemostat and found no other not loculations.  I irrigated with 50 mL normal saline with no return of purulence.  I then tightly repacked his perineum.  There were no other fluctuant areas.  There is an area of induration just inferior to this.  No evidence of crepitus.  No skin necrosis.  No concern of Fournier's. ?Neurologic: Grossly intact, no focal deficits ?Psychiatric: Normal  mood and affect ? ?Laboratory Data:  ?Recent Labs  ?  12/24/21 ?1245  ?WBC 12.8*  ?HGB 15.4  ?HCT 45.4  ?PLT 176  ? ? ?Recent Labs  ?  12/24/21 ?1245  ?NA 136  ?K 4.2  ?CL 106  ?GLUCOSE 123*  ?BUN 20  ?CALCIUM 8.9  ?CREATININE 1.47*  ? ? ? ?Results for orders placed or performed during the hospital encounter of 12/24/21 (from the past 24 hour(s))  ?Lactic acid, plasma     Status: None  ? Collection Time: 12/24/21 11:37 AM  ?Result Value Ref Range  ? Lactic Acid, Venous 1.6 0.5 - 1.9 mmol/L  ?Lactic acid, plasma     Status: None  ? Collection Time: 12/24/21 12:45 PM  ?Result Value Ref Range  ? Lactic Acid, Venous 1.0 0.5 - 1.9 mmol/L  ?Comprehensive metabolic panel  Status: Abnormal  ? Collection Time: 12/24/21 12:45 PM  ?Result Value Ref Range  ? Sodium 136 135 - 145 mmol/L  ? Potassium 4.2 3.5 - 5.1 mmol/L  ? Chloride 106 98 - 111 mmol/L  ? CO2 22 22 - 32 mmol/L  ? Glucose, Bld 123 (H) 70 - 99 mg/dL  ? BUN 20 6 - 20 mg/dL  ? Creatinine, Ser 1.47 (H) 0.61 - 1.24 mg/dL  ? Calcium 8.9 8.9 - 10.3 mg/dL  ? Total Protein 6.6 6.5 - 8.1 g/dL  ? Albumin 3.7 3.5 - 5.0 g/dL  ? AST 23 15 - 41 U/L  ? ALT 29 0 - 44 U/L  ? Alkaline Phosphatase 36 (L) 38 - 126 U/L  ? Total Bilirubin 1.3 (H) 0.3 - 1.2 mg/dL  ? GFR, Estimated 57 (L) >60 mL/min  ? Anion gap 8 5 - 15  ?CBC with Differential     Status: Abnormal  ? Collection Time: 12/24/21 12:45 PM  ?Result Value Ref Range  ? WBC 12.8 (H) 4.0 - 10.5 K/uL  ? RBC 5.30 4.22 - 5.81 MIL/uL  ? Hemoglobin 15.4 13.0 - 17.0 g/dL  ? HCT 45.4 39.0 - 52.0 %  ? MCV 85.7 80.0 - 100.0 fL  ? MCH 29.1 26.0 - 34.0 pg  ? MCHC 33.9 30.0 - 36.0 g/dL  ? RDW 13.4 11.5 - 15.5 %  ? Platelets 176 150 - 400 K/uL  ? nRBC 0.0 0.0 - 0.2 %  ? Neutrophils Relative % 83 %  ? Neutro Abs 10.8 (H) 1.7 - 7.7 K/uL  ? Lymphocytes Relative 7 %  ? Lymphs Abs 0.8 0.7 - 4.0 K/uL  ? Monocytes Relative 7 %  ? Monocytes Absolute 0.9 0.1 - 1.0 K/uL  ? Eosinophils Relative 2 %  ? Eosinophils Absolute 0.2 0.0 - 0.5 K/uL  ?  Basophils Relative 0 %  ? Basophils Absolute 0.0 0.0 - 0.1 K/uL  ? Immature Granulocytes 1 %  ? Abs Immature Granulocytes 0.08 (H) 0.00 - 0.07 K/uL  ?Protime-INR     Status: None  ? Collection Time: 12/24/21 12:45 P

## 2021-12-24 NOTE — Consult Note (Signed)
PHARMACY -  BRIEF ANTIBIOTIC NOTE  ? ?Pharmacy has received consult(s) for vancomycin from an ED provider.  The patient's profile has been reviewed for ht/wt/allergies/indication/available labs.   ? ?One time order(s) placed for  ?--Vancomycin 2 g IV  ? ?Further antibiotics/pharmacy consults should be ordered by admitting physician if indicated.       ?                ?Thank you, ?Benita Gutter ?12/24/2021  11:41 AM  ?

## 2021-12-24 NOTE — ED Notes (Signed)
Patient noted to have oxygen saturations around 89% to 91% on 2 L [place].  No hx of COPD.  Increased oxygen to 3 L with improvement in saturations.  ?

## 2021-12-24 NOTE — Consult Note (Signed)
Pharmacy Antibiotic Note ? ?Cory Johnston is a 51 y.o. male admitted on 12/24/2021 with cellulitis/scrotal abscess.  Pharmacy has been consulted for Vancomycin and Zosyn dosing. ? ?Plan: ?Zosyn 3.375g IV q8h (4 hour infusion). ?Vancomycin 2000 mg LD x 1 given in ED ?Initiate Vancomycin 1000 mg Q12H. Goal AUC 400-550 ?Estimated AUC 428/Cmin: 13.0 ?Scr 1.47, IBW, Vd 0.72   ? ? ?Height: '6\' 1"'$  (185.4 cm) ?Weight: 108.9 kg (240 lb) ?IBW/kg (Calculated) : 79.9 ? ?Temp (24hrs), Avg:100.7 ?F (38.2 ?C), Min:99.7 ?F (37.6 ?C), Max:102.4 ?F (39.1 ?C) ? ?Recent Labs  ?Lab 12/24/21 ?1137 12/24/21 ?1245  ?WBC  --  12.8*  ?CREATININE  --  1.47*  ?LATICACIDVEN 1.6 1.0  ?  ?Estimated Creatinine Clearance: 76.1 mL/min (A) (by C-G formula based on SCr of 1.47 mg/dL (H)).   ? ?No Known Allergies ? ?Antimicrobials this admission: ?4/30 ceftriaxone x 1  ?4/30 Vancomycin  >>  ?4/30 Zosyn >> ? ?Dose adjustments this admission: ? ? ?Microbiology results: ?4/30 BCx: 4/30 ?4/30 UCx: sent  ? ? ?Thank you for allowing pharmacy to be a part of this patient?s care. ? ?Dorothe Pea, PharmD, BCPS ?Clinical Pharmacist   ?12/24/2021 5:45 PM ? ?

## 2021-12-24 NOTE — ED Provider Notes (Signed)
? ?[facility] ?Provider Note ? ?Patient Contact: 10:42 AM (approximate) ? ? ?History  ? ?Abscess (Bottom left side of scrotum.) ? ? ?HPI ? ?Cory Johnston is a 51 y.o. male  presents to the urgent care with concern for a left-sided testicular abscess.  Patient has had swelling of both the right and left testicles with overlying erythema.  He states that he has been nauseated and has had fever for the past 2 to 3 days.  He has had cutaneous abscesses in the past but has never had a scrotal abscess.  He denies dysuria or increased urinary frequency.  No low back pain. ? ?  ? ? ?Physical Exam  ? ?Triage Vital Signs: ?ED Triage Vitals [12/24/21 0928]  ?Enc Vitals Group  ?   BP 140/83  ?   Pulse Rate 82  ?   Resp 15  ?   Temp 100 ?F (37.8 ?C)  ?   Temp Source Oral  ?   SpO2 95 %  ?   Weight 240 lb (108.9 kg)  ?   Height '6\' 1"'$  (1.854 m)  ?   Head Circumference   ?   Peak Flow   ?   Pain Score 7  ?   Pain Loc   ?   Pain Edu?   ?   Excl. in [place]?   ? ? ?Most recent vital signs: ?Vitals:  ? 12/24/21 0928  ?BP: 140/83  ?Pulse: 82  ?Resp: 15  ?Temp: 100 ?F (37.8 ?C)  ?SpO2: 95%  ? ?General: Alert and in no acute distress. ?Eyes:  PERRL. EOMI. ?Head: No acute traumatic findings ?ENT: ?     Nose: No congestion/rhinnorhea. ?     Mouth/Throat: Mucous membranes are moist.  ?Neck: No stridor. No cervical spine tenderness to palpation. ?Cardiovascular:  Good peripheral perfusion ?Respiratory: Normal respiratory effort without tachypnea or retractions. Lungs CTAB. Good air entry to the bases with no decreased or absent breath sounds. ?Gastrointestinal: Bowel sounds ?4 quadrants. Soft and nontender to palpation. No guarding or rigidity. No palpable masses. No distention. No CVA tenderness. ?Musculoskeletal: Full range of motion to all extremities.  ?Neurologic:  No gross focal neurologic deficits are appreciated.  ?Skin: Patient has edema and erythema of both testicles with some palpable induration along the  inferior aspect of the left testicle. ? ? ? ?ED Results / Procedures / Treatments  ? ?Labs ?(all labs ordered are listed, but only abnormal results are displayed) ?Labs Reviewed - No data to display ? ? ? ? ?PROCEDURES: ? ?Critical Care performed: No ? ?Procedures ? ? ?MEDICATIONS ORDERED IN ED: ?Medications  ?cefTRIAXone (ROCEPHIN) injection 1 g (1 g Intramuscular Given 12/24/21 1034)  ? ? ? ?IMPRESSION / MDM / ASSESSMENT AND PLAN / ED COURSE  ?I reviewed the triage vital signs and the nursing notes. ?             ?               ? ?Assessment and plan ?Scrotal erythema/abscess ?Differential diagnosis includes scrotal abscess, cellulitis, Fournier's gangrene, testicular torsion ? ?51 year old male with history of cutaneous abscesses presents to the emergency department with scrotal erythema and edema and worsening left-sided testicular pain. ? ?Patient had low-grade fever at triage but vital signs were otherwise reassuring.  He underwent incision and drainage for scrotal abscess in the urgent care with some purulent drainage.  Abscess was packed.  I discussed possible complications of scrotal  including  testicular torsion and Fournier's gangrene and referred patient to the emergency department for scrotal ultrasound.  He was given IM Rocephin in the urgent care and started on Augmentin to cover him for anaerobes and doxycycline to cover him for MRSA.  I notified [facility] ED attending [place] prior to referral to ED.  Short course of Norco was prescribed for pain. ? ?  ? ? ?FINAL CLINICAL IMPRESSION(S) / ED DIAGNOSES  ? ?Final diagnoses:  ?Abscess  ? ? ? ?Rx / DC Orders  ? ?ED Discharge Orders   ? ?      Ordered  ?  amoxicillin-clavulanate (AUGMENTIN) 875-125 MG tablet  2 times daily       ? 12/24/21 1031  ?  doxycycline (VIBRAMYCIN) 100 MG capsule  2 times daily       ? 12/24/21 1031  ?  HYDROcodone-acetaminophen (NORCO/VICODIN) 5-325 MG tablet  Every 6 hours PRN       ? 12/24/21 1035  ?  ondansetron  (ZOFRAN-ODT) 4 MG disintegrating tablet  Every 8 hours PRN       ? 12/24/21 1035  ? ?  ?  ? ?  ? ? ? ?Note:  This document was prepared using Dragon voice recognition software and may include unintentional dictation errors. ?  ?Lannie Fields, PA-C ?12/24/21 1046 ? ?

## 2021-12-24 NOTE — ED Notes (Signed)
Pt going upstairs with transport ? ?

## 2021-12-24 NOTE — Progress Notes (Signed)
CODE SEPSIS - PHARMACY COMMUNICATION ? ?**Broad Spectrum Antibiotics should be administered within 1 hour of Sepsis diagnosis** ? ?Time Code Sepsis Called/Page Received: 6116 ? ?Antibiotics Ordered: Vancomycin + Zosyn ? ?Time of 1st antibiotic administration: 1310 (vancomycin only ordered initially thus why given first). Zosyn administered at 1418 ? ?Patient apparently with extensive rash which was present on admission with onset after IM injection of ceftriaxone he received on 4/30. RN to administer Zosyn after completion of vancomycin.  ? ?Additional action taken by pharmacy: N/A ? ?Benita Gutter ?12/24/2021  1:21 PM  ?

## 2021-12-24 NOTE — ED Triage Notes (Signed)
Patient C/O abscess on bottom left of scrotum that started Friday 12/22/21. Patient complains of swelling, and itching and some minor drainage. Patient stated he has been running a fever around 101.2 since last night.  ?

## 2021-12-24 NOTE — Progress Notes (Signed)
Elink following for sepsis protocol. 

## 2021-12-24 NOTE — ED Notes (Addendum)
Pt knows need for ua and is unable to void at this time  ? ?

## 2021-12-24 NOTE — ED Provider Notes (Signed)
? ?[facility] ?Provider Note ? ? ? Event Date/Time  ? First MD Initiated Contact with Patient 12/24/21 1114   ?  (approximate) ? ? ?History  ? ?Abscess ? ? ?HPI ? ?Cory Johnston is a 51 y.o. male with a past medical history of HDL and HTN as well as previous abscesses in the armpit and calf who presents after being referred from urgent care for further evaluation of bilateral scrotal pain redness and swelling that he states started over the past 3 to 4 days.  He endorses fevers at home.  Also endorses headache and some nausea.  No chest pain, cough, shortness of breath, abdominal pain, back pain, burning with urination, vomiting or diarrhea.  No trauma.  No prior scrotal abscesses or infections.  Denies any history of diabetes.  Scrotal abscess was I&D at urgent care and packed patient was given 500 mg of IM Rocephin and Rx for Augmentin and Doxy. ? ?  ? ? ?Physical Exam  ?Triage Vital Signs: ?ED Triage Vitals  ?Enc Vitals Group  ?   BP 12/24/21 1114 135/78  ?   Pulse Rate 12/24/21 1112 91  ?   Resp 12/24/21 1112 20  ?   Temp 12/24/21 1112 (!) 100.8 ?F (38.2 ?C)  ?   Temp Source 12/24/21 1112 Oral  ?   SpO2 12/24/21 1112 97 %  ?   Weight 12/24/21 1113 240 lb (108.9 kg)  ?   Height 12/24/21 1113 '6\' 1"'$  (1.854 m)  ?   Head Circumference --   ?   Peak Flow --   ?   Pain Score 12/24/21 1113 8  ?   Pain Loc --   ?   Pain Edu? --   ?   Excl. in [place]? --   ? ? ?Most recent vital signs: ?Vitals:  ? 12/24/21 1337 12/24/21 1430  ?BP: 109/68 108/69  ?Pulse: 80 74  ?Resp: 20 13  ?Temp:    ?SpO2: 96%   ? ? ?General: Awake, seems uncomfortable. ?CV:  Good peripheral perfusion.  2+ radial pulse. ?Resp:  Normal effort.  ?Abd:  No distention.  Soft. ?Other:  Bilateral testicular and scrotal enlargement erythema and tenderness extending to just anterior to the perineal area.  Packing is in place in the lateral side site of I&D. ? ?Facial erythema noted as well as some erythema over the chest and arms.  Patient  wife states that he thought this was related to his fever did not occur after he received Rocephin in urgent care. ? ? ?ED Results / Procedures / Treatments  ?Labs ?(all labs ordered are listed, but only abnormal results are displayed) ?Labs Reviewed  ?COMPREHENSIVE METABOLIC PANEL - Abnormal; Notable for the following components:  ?    Result Value  ? Glucose, Bld 123 (*)   ? Creatinine, Ser 1.47 (*)   ? Alkaline Phosphatase 36 (*)   ? Total Bilirubin 1.3 (*)   ? GFR, Estimated 57 (*)   ? All other components within normal limits  ?CBC WITH DIFFERENTIAL/PLATELET - Abnormal; Notable for the following components:  ? WBC 12.8 (*)   ? Neutro Abs 10.8 (*)   ? Abs Immature Granulocytes 0.08 (*)   ? All other components within normal limits  ?RESP PANEL BY RT-PCR (FLU A&B, COVID) [id number]  ?CULTURE, BLOOD (ROUTINE X 2)  ?CULTURE, BLOOD (ROUTINE X 2)  ?URINE CULTURE  ?LACTIC ACID, PLASMA  ?LACTIC ACID, PLASMA  ?PROTIME-INR  ?APTT  ?URINALYSIS,  COMPLETE (UACMP) WITH MICROSCOPIC  ?TROPONIN I (HIGH SENSITIVITY)  ? ? ? ?EKG ? ?EKG is remarkable sinus rhythm with a ventricular rate of 87, normal axis, unremarkable intervals with nonspecific T wave changes in lead III, aVF, V5 and V6.  No other acute ischemic changes or arrhythmia.  Normal axis. ? ?RADIOLOGY ?Chest reviewed by myself shows no focal consoidation, effusion, edema, pneumothorax or other clear acute thoracic process. I also reviewed radiology interpretation and agree with findings described. ? ?Pelvic CT on my interpretation shows inflammation and edema around the left inferior scrotum consistent with a cellulitis.  No focal fluid collection or subcu gas.  No other deep space abscess.  There is some pelvic lymphadenopathy.  I also reviewed radiology's interpretation. ? ?Scrotal ultrasound shows edema around the left hemiscrotum no discrete abscess.  I reviewed radiologist rotation and agree with the findings of focal edema in the superficial soft tissues of the  posterior left scrotum with some shadowing possibly representing evolving abscess or phlegmon without evidence of torsion, orchitis, epididymitis or other clear acute process.  I also agree with their notation of several microliths. ? ? ?PROCEDURES: ? ?Critical Care performed: Yes, see critical care procedure note(s) ? ?.Critical Care ?Performed by: Lucrezia Starch, MD ?Authorized by: Lucrezia Starch, MD  ? ?Critical care provider statement:  ?  Critical care time (minutes):  30 ?  Critical care was necessary to treat or prevent imminent or life-threatening deterioration of the following conditions:  Sepsis and respiratory failure ?  Critical care was time spent personally by me on the following activities:  Development of treatment plan with patient or surrogate, discussions with consultants, evaluation of patient's response to treatment, examination of patient, ordering and review of laboratory studies, ordering and review of radiographic studies, ordering and performing treatments and interventions, pulse oximetry, re-evaluation of patient's condition and review of old charts ? ? ? ?MEDICATIONS ORDERED IN ED: ?Medications  ?acetaminophen (OFIRMEV) IV 1,000 mg (0 mg Intravenous Stopped 12/24/21 1300)  ?vancomycin (VANCOREADY) IVPB 2000 mg/400 mL (2,000 mg Intravenous New Bag/Given 12/24/21 1310)  ?ondansetron [facility]) 4 MG/2ML injection (  Not Given 12/24/21 1151)  ?piperacillin-tazobactam (ZOSYN) IVPB 3.375 g (3.375 g Intravenous New Bag/Given 12/24/21 1418)  ?lactated ringers bolus 2,000 mL (0 mLs Intravenous Stopped 12/24/21 1415)  ?morphine (PF) 4 MG/ML injection 6 mg (6 mg Intravenous Given 12/24/21 1143)  ?ondansetron [facility]) injection 4 mg (4 mg Intravenous Given 12/24/21 1149)  ?ketorolac (TORADOL) 30 MG/ML injection 15 mg (15 mg Intravenous Given 12/24/21 1342)  ?iohexol (OMNIPAQUE) 300 MG/ML solution 80 mL (80 mLs Intravenous Contrast Given 12/24/21 1353)  ?sodium chloride 0.9 % bolus 1,000 mL (0 mLs  Intravenous Stopped 12/24/21 1422)  ?0.9 %  sodium chloride infusion ( Intravenous New Bag/Given 12/24/21 1437)  ? ? ? ?IMPRESSION / MDM / ASSESSMENT AND PLAN / ED COURSE  ?I reviewed the triage vital signs and the nursing notes. ?             ?               ? ?Differential diagnosis includes, but is not limited to scrotal abscess, cellulitis, epididymitis, orchitis, Fournier's gangrene and possible deep pelvic abscess. ? ?CMP is remarkable for a creatinine of 1.47 compared to 1.46 months ago without any other new significant electrolyte or metabolic derangements.  CBC shows WBC count of 12.8 with normal hemoglobin and normal platelets.  PTT and INR are within normal limits.  Lactic acid 1.6-1.0. ? ?  Concern for sepsis with fever and leukocytosis blood cultures obtained and patient started on broad-spectrum antibiotics and IV fluids. ? ?EKG is remarkable sinus rhythm with a ventricular rate of 87, normal axis, unremarkable intervals with nonspecific T wave changes in lead III, aVF, V5 and V6.  No other acute ischemic changes or arrhythmia.  Normal axis.  Troponin is not suggestive of any demand ischemia or ACS. ? ?Pelvic CT on my interpretation shows inflammation and edema around the left inferior scrotum consistent with a cellulitis.  No focal fluid collection or subcu gas.  No other deep space abscess.  There is some pelvic lymphadenopathy.  I also reviewed radiology's interpretation. ? ?Scrotal ultrasound shows edema around the left hemiscrotum no discrete abscess.  I reviewed radiologist rotation and agree with the findings of focal edema in the superficial soft tissues of the posterior left scrotum with some shadowing possibly representing evolving abscess or phlegmon without evidence of torsion, orchitis, epididymitis or other clear acute process.  I also agree with their notation of several microliths. ? ?Of note while going initial work-up after receiving some worsening patient had a brief drop in his SPO2 to  89% which improved back to mid 90s on 2 L nasal cannula.  He has no respiratory symptoms although I will add a chest x-ray and a COVID swab as well as the above-noted sepsis work-up.  These are both negative, suspect s

## 2021-12-24 NOTE — ED Notes (Signed)
Patient is being discharged from the Urgent Care and sent to the Emergency Department via private vehicle . Per Vallarie Mare, PA, patient is in need of higher level of care due to needing [place]. Patient is aware and verbalizes understanding of plan of care.  ?Vitals:  ? 12/24/21 0928  ?BP: 140/83  ?Pulse: 82  ?Resp: 15  ?Temp: 100 ?F (37.8 ?C)  ?SpO2: 95%  ?  ?

## 2021-12-24 NOTE — ED Notes (Signed)
Spoke with provider regarding fluids and pt output. Bladder scan done will give fluids per provider order.  ? ?

## 2021-12-24 NOTE — ED Notes (Signed)
Patient transported to CT 

## 2021-12-24 NOTE — Discharge Instructions (Addendum)
Please go to [facility] for scrotal ultrasound to rule out torsion or other abscess location. ?You have been prescribed Augmentin and doxycycline which will cover you for both MRSA and anaerobes. ?You have been prescribed Norco for pain. ?

## 2021-12-24 NOTE — Progress Notes (Signed)
PHARMACIST - PHYSICIAN COMMUNICATION ? ?CONCERNING:  Enoxaparin (Lovenox) for DVT Prophylaxis  ? ?DESCRIPTION: ?Patient was prescribed enoxaprin '40mg'$  q24 hours for VTE prophylaxis.  ? Danley Danker Weights  ? 12/24/21 1113  ?Weight: 108.9 kg (240 lb)  ? ? ?Body mass index is 31.66 kg/m?. ? ?Estimated Creatinine Clearance: 76.1 mL/min (A) (by C-G formula based on SCr of 1.47 mg/dL (H)). ? ? ?Based on [place] patient is candidate for enoxaparin 0.'5mg'$ /kg TBW SQ every 24 hours based on BMI being >30. ? ? ?RECOMMENDATION: ?Pharmacy has adjusted enoxaparin dose per [facility] policy. ? ?Patient is now receiving enoxaparin 55 mg every 24 hours  ? ? ?Darnelle Bos, PharmD ?Clinical Pharmacist  ?12/24/2021 ?5:37 PM ? ?

## 2021-12-24 NOTE — ED Notes (Signed)
Chaperoned with Tamala Julian, MD for scrotal exam. Pt has a area to bottom of scrotum that was packed and drained earlier today. Pt scrotum is red and swollen.  ?

## 2021-12-25 ENCOUNTER — Encounter: Payer: Self-pay | Admitting: Internal Medicine

## 2021-12-25 ENCOUNTER — Inpatient Hospital Stay: Payer: BC Managed Care – PPO

## 2021-12-25 DIAGNOSIS — N492 Inflammatory disorders of scrotum: Secondary | ICD-10-CM | POA: Diagnosis not present

## 2021-12-25 DIAGNOSIS — L03818 Cellulitis of other sites: Secondary | ICD-10-CM | POA: Diagnosis not present

## 2021-12-25 DIAGNOSIS — A419 Sepsis, unspecified organism: Secondary | ICD-10-CM | POA: Diagnosis present

## 2021-12-25 DIAGNOSIS — R6520 Severe sepsis without septic shock: Secondary | ICD-10-CM

## 2021-12-25 LAB — COMPREHENSIVE METABOLIC PANEL
ALT: 72 U/L — ABNORMAL HIGH (ref 0–44)
AST: 61 U/L — ABNORMAL HIGH (ref 15–41)
Albumin: 3.2 g/dL — ABNORMAL LOW (ref 3.5–5.0)
Alkaline Phosphatase: 38 U/L (ref 38–126)
Anion gap: 6 (ref 5–15)
BUN: 18 mg/dL (ref 6–20)
CO2: 24 mmol/L (ref 22–32)
Calcium: 8.6 mg/dL — ABNORMAL LOW (ref 8.9–10.3)
Chloride: 108 mmol/L (ref 98–111)
Creatinine, Ser: 1.51 mg/dL — ABNORMAL HIGH (ref 0.61–1.24)
GFR, Estimated: 55 mL/min — ABNORMAL LOW (ref >60)
Glucose, Bld: 128 mg/dL — ABNORMAL HIGH (ref 70–99)
Potassium: 4.1 mmol/L (ref 3.5–5.1)
Sodium: 138 mmol/L (ref 135–145)
Total Bilirubin: 0.9 mg/dL (ref 0.3–1.2)
Total Protein: 6 g/dL — ABNORMAL LOW (ref 6.5–8.1)

## 2021-12-25 LAB — CBC
HCT: 41.1 % (ref 39.0–52.0)
Hemoglobin: 13.9 g/dL (ref 13.0–17.0)
MCH: 29.8 pg (ref 26.0–34.0)
MCHC: 33.8 g/dL (ref 30.0–36.0)
MCV: 88.2 fL (ref 80.0–100.0)
Platelets: 146 10*3/uL — ABNORMAL LOW (ref 150–400)
RBC: 4.66 MIL/uL (ref 4.22–5.81)
RDW: 13.4 % (ref 11.5–15.5)
WBC: 11.7 10*3/uL — ABNORMAL HIGH (ref 4.0–10.5)
nRBC: 0 % (ref 0.0–0.2)

## 2021-12-25 LAB — HIV ANTIBODY (ROUTINE TESTING W REFLEX): HIV Screen 4th Generation wRfx: NONREACTIVE

## 2021-12-25 LAB — BASIC METABOLIC PANEL
Anion gap: 14 (ref 5–15)
BUN: 17 mg/dL (ref 6–20)
CO2: 21 mmol/L — ABNORMAL LOW (ref 22–32)
Calcium: 8.6 mg/dL — ABNORMAL LOW (ref 8.9–10.3)
Chloride: 100 mmol/L (ref 98–111)
Creatinine, Ser: 1.46 mg/dL — ABNORMAL HIGH (ref 0.61–1.24)
GFR, Estimated: 58 mL/min — ABNORMAL LOW (ref >60)
Glucose, Bld: 99 mg/dL (ref 70–99)
Potassium: 3.5 mmol/L (ref 3.5–5.1)
Sodium: 135 mmol/L (ref 135–145)

## 2021-12-25 LAB — GLUCOSE, CAPILLARY
Glucose-Capillary: 127 mg/dL — ABNORMAL HIGH (ref 70–99)
Glucose-Capillary: 117 mg/dL — ABNORMAL HIGH (ref 70–99)
Glucose-Capillary: 123 mg/dL — ABNORMAL HIGH (ref 70–99)
Glucose-Capillary: 116 mg/dL — ABNORMAL HIGH (ref 70–99)

## 2021-12-25 LAB — HEMOGLOBIN A1C
Hgb A1c MFr Bld: 5.9 % — ABNORMAL HIGH (ref 4.8–5.6)
Mean Plasma Glucose: 122.63 mg/dL

## 2021-12-25 LAB — MRSA NEXT GEN BY PCR, NASAL: MRSA by PCR Next Gen: DETECTED — AB

## 2021-12-25 MED ORDER — VANCOMYCIN HCL 1500 MG/300ML IV SOLN
1500.0000 mg | INTRAVENOUS | Status: AC
  Administered 2021-12-25: 1500 mg via INTRAVENOUS
  Filled 2021-12-25: qty 300

## 2021-12-25 MED ORDER — CHLORHEXIDINE GLUCONATE CLOTH 2 % EX PADS
6.0000 | MEDICATED_PAD | Freq: Every day | CUTANEOUS | Status: DC
  Administered 2021-12-25 – 2021-12-27 (×3): 6 via TOPICAL

## 2021-12-25 MED ORDER — FUROSEMIDE 10 MG/ML IJ SOLN
20.0000 mg | Freq: Once | INTRAMUSCULAR | Status: AC
  Administered 2021-12-26: 20 mg via INTRAVENOUS
  Filled 2021-12-25: qty 2

## 2021-12-25 MED ORDER — LINEZOLID 600 MG PO TABS
600.0000 mg | ORAL_TABLET | Freq: Two times a day (BID) | ORAL | Status: DC
  Filled 2021-12-25: qty 1

## 2021-12-25 MED ORDER — PSYLLIUM 95 % PO PACK
1.0000 | PACK | Freq: Every day | ORAL | Status: DC
  Filled 2021-12-25 (×2): qty 1

## 2021-12-25 MED ORDER — PANTOPRAZOLE SODIUM 40 MG IV SOLR
40.0000 mg | Freq: Two times a day (BID) | INTRAVENOUS | Status: DC
  Administered 2021-12-25: 40 mg via INTRAVENOUS
  Filled 2021-12-25: qty 10

## 2021-12-25 MED ORDER — FENOFIBRATE 160 MG PO TABS
160.0000 mg | ORAL_TABLET | Freq: Every day | ORAL | Status: DC
  Administered 2021-12-25 – 2021-12-27 (×3): 160 mg via ORAL
  Filled 2021-12-25 (×3): qty 1

## 2021-12-25 MED ORDER — ONDANSETRON HCL 4 MG/2ML IJ SOLN
4.0000 mg | Freq: Four times a day (QID) | INTRAMUSCULAR | Status: DC | PRN
Start: 2021-12-25 — End: 2021-12-27
  Administered 2021-12-25: 4 mg via INTRAVENOUS
  Filled 2021-12-25: qty 2

## 2021-12-25 MED ORDER — NITROGLYCERIN 0.4 MG SL SUBL
SUBLINGUAL_TABLET | SUBLINGUAL | Status: AC
  Filled 2021-12-25: qty 1

## 2021-12-25 MED ORDER — OXYCODONE HCL 5 MG PO TABS
5.0000 mg | ORAL_TABLET | Freq: Four times a day (QID) | ORAL | Status: AC | PRN
  Administered 2021-12-25 – 2021-12-26 (×2): 5 mg via ORAL
  Filled 2021-12-25 (×3): qty 1

## 2021-12-25 MED ORDER — NITROGLYCERIN 0.4 MG SL SUBL
0.4000 mg | SUBLINGUAL_TABLET | SUBLINGUAL | Status: DC | PRN
  Administered 2021-12-25: 0.4 mg via SUBLINGUAL

## 2021-12-25 MED ORDER — NIACIN ER 500 MG PO TBCR
500.0000 mg | EXTENDED_RELEASE_TABLET | Freq: Every day | ORAL | Status: DC
Start: 2021-12-25 — End: 2021-12-27
  Filled 2021-12-25 (×2): qty 1

## 2021-12-25 MED ORDER — LINEZOLID 600 MG PO TABS: 600.0000 mg | ORAL_TABLET | Freq: Two times a day (BID) | ORAL | Status: DC

## 2021-12-25 MED ORDER — LINEZOLID 600 MG/300ML IV SOLN
600.0000 mg | Freq: Two times a day (BID) | INTRAVENOUS | Status: DC
  Administered 2021-12-26 – 2021-12-27 (×3): 600 mg via INTRAVENOUS
  Filled 2021-12-25 (×3): qty 300

## 2021-12-25 MED ORDER — IOHEXOL 350 MG/ML SOLN
75.0000 mL | Freq: Once | INTRAVENOUS | Status: AC | PRN
  Administered 2021-12-25: 75 mL via INTRAVENOUS

## 2021-12-25 MED ORDER — FUROSEMIDE 10 MG/ML IJ SOLN
40.0000 mg | Freq: Once | INTRAMUSCULAR | Status: AC
  Administered 2021-12-25: 40 mg via INTRAVENOUS

## 2021-12-25 MED ORDER — SUCRALFATE 1 GM/10ML PO SUSP
1.0000 g | Freq: Three times a day (TID) | ORAL | Status: DC
  Administered 2021-12-26 – 2021-12-27 (×6): 1 g via ORAL
  Filled 2021-12-25 (×7): qty 10

## 2021-12-25 NOTE — Consult Note (Signed)
Pharmacy Antibiotic Note ? ?Cory Johnston is a 51 y.o. male admitted on 12/24/2021 with cellulitis/scrotal abscess.  Pharmacy has been consulted for Vancomycin and Zosyn dosing. ? ?Plan: ?Zosyn 3.375g IV q8h (4 hour infusion). ? ?2. Vancomycin 2000 mg LD x 1 given in ED , then  ?    Initiate Vancomycin 1500 mg Q24H. ?Goal AUC 400-550 ?Estimated AUC 474/Cmin 9.9 ?Scr 1.51, IBW, Vd 0.5  ? ?Height: '6\' 1"'$  (185.4 cm) ?Weight: 108.9 kg (240 lb) ?IBW/kg (Calculated) : 79.9 ? ?Temp (24hrs), Avg:99.6 ?F (37.6 ?C), Min:97.9 ?F (36.6 ?C), Max:102.4 ?F (39.1 ?C) ? ?Recent Labs  ?Lab 12/24/21 ?1137 12/24/21 ?1245 12/24/21 ?2030 12/25/21 ?0416  ?WBC  --  12.8* 11.6* 11.7*  ?CREATININE  --  1.47* 1.57* 1.51*  ?LATICACIDVEN 1.6 1.0  --   --   ? ?  ?Estimated Creatinine Clearance: 74.1 mL/min (A) (by C-G formula based on SCr of 1.51 mg/dL (H)).   ? ?No Known Allergies ? ?Antimicrobials this admission: ?4/30 ceftriaxone x 1  ?4/30 Vancomycin  >>  ?4/30 Zosyn >> ? ?Dose adjustments this admission: ? ? ?Microbiology results: ?4/30 BCx: 4/30 ?4/30 UCx: sent  ? ? ?Thank you for allowing pharmacy to be a part of this patient?s care. ? ?Baylon Santelli Rodriguez-Guzman PharmD, BCPS ?12/25/2021 10:14 AM ? ?

## 2021-12-25 NOTE — Progress Notes (Addendum)
? ?      CROSS COVER NOTE ? ?NAME: Cory Johnston ?MRN: [id number] ?DOB : 06-Sep-1970 ? ?Contacted by swing admitter to follow up on patient that had a rapid response around 1800 today. ? ?MAPs are currently in high 70s-80s. Still on 7L of supplemental oxygen via nasal cannula and patient is conversant with no dyspnea. At this time [name] denies dyspnea, palpitations, chest pain, dizziness, or fatigue. On exam he has fine crackles in bilateral lower lung fields. 20 mg IV lasix ordered. BMP, Mg, and CBC added to AM labs. ? ?Wife at bedside is inquiring about results of Troponin from episode of chest pain around 1800. No Troponin resulted since 4/30. Will add Troponin to AM labs. ? ? ?Neomia Glass MHA, MSN, FNP-BC ?Nurse Practitioner ?[facility] ?[place] ?Pager [phone] ? ?

## 2021-12-25 NOTE — Progress Notes (Signed)
Rapid Response Event Note  ? ?Reason for Call : shortness of breath ? ? ?Initial Focused Assessment: On my arrival, patient is standing up at side of bed, very short of breath. Pt is alert and oriented. Pt is currently on 7L [place] and says his breathing has improved somewhat since he has been standing. Pt also started having chest pain before my arrival and 1 nitro was given. Pt states chest pressure has also improved some. O2 sats 91% and all other VS stable at this time. Primary RN states pt's BP has been soft and he has been receiving fluids to help keep BP up. Pt has audible crackles on auscultation of lungs. ? ?Interventions: 12 lead EKG was performed. '40mg'$  lasix was also ordered by Dr. Mal Misty and administered before I arrived to room. Chest xray ordered. RT, primary RN, and MD at bedside. Pt's family also present at bedside. Maintenance fluids stopped. ? ? ?Plan of Care: Orders received to transfer pt to stepdown for closer monitoring and possible need for pressors. Pt will transfer to ICU 2 when room is clean. Primary RN/charge RN on 2A aware of no clean room at this time. ? ? ? ?Event Summary:  ? ?MD Notified: Dr. Mal Misty ?Call Time: 1823 ?Arrival Time: 7414 ?End Time: Awaiting for pt to be transferred to SDU ? ?Trellis Paganini, RN ?

## 2021-12-25 NOTE — Progress Notes (Signed)
Scrotum packing removed / wound cultures obtained/ wound cleaned/ and re packed by urology and ID.  ?

## 2021-12-25 NOTE — Progress Notes (Signed)
RN called to room - pt c/o feeling like he is "running a fever again"/ temp 100.9 / tylenol given/ while at bedside pt c/o sob and chest pain / EKG obtained/ no changes on tele/ morphine and SL nitro given/ 02 sats 80% on RA/ o2 improved 91% on 7L [place]/ MD paged and rapid response called/  chest xray ordered/ fluids stopped and '40mg'$  IV lasix given / pt reports some improvement with breathing and chest  pain / MD at bedside and ordered to transfer to CCU for closer monitoring.  ?

## 2021-12-25 NOTE — Progress Notes (Addendum)
Nutrition Brief Note ? ?RD pulled to chart secondary to CHF diagnosis.  ? ?Wt Readings from Last 15 Encounters:  ?12/24/21 108.9 kg  ?12/24/21 108.9 kg  ?04/25/21 106.6 kg  ?11/07/20 108 kg  ?10/20/20 109 kg  ?10/13/20 109.3 kg  ?10/08/18 108.9 kg  ?01/23/18 103.9 kg  ?12/19/17 98.2 kg  ?10/23/17 105.2 kg  ?10/15/17 99.8 kg  ?02/01/16 99.8 kg  ?08/25/15 104.7 kg  ? ?Cory Johnston is an 51 y.o. male 51 year old man who was diagnosed with diabetes with hemoglobin A1c 6.4 and started on Ozempic last week, also has hypertension was in [facility] H until 3 days ago when he noted "a small bump down there like a chigger bite".  He noted that that area became swollen and red and tender.  Also noted some fevers and chills yesterday.  His girlfriend is an Therapist, sports and she had him come in today. ? ?Pt admitted with scrotal abscess with sepsis.  ? ?Reviewed I/O's: +4.9 L x 24 hours ? ?UOP: 825 ml x 24 hours ? ?Spoke with pt and family members at bedside. Pt reports good appetite both presently and PTA. He shares that he is always hungry and eats large portions of food. PTA he was consuming 3 meals per day which consist of smoked or grilled meat and vegetables. Pt and family do most of their own cooking and food preparation.  ?  ?Pt denies any weight loss. Reviewed wt hx; wt has been stable over the past year.  ? ?Pt declines offer of extra protein as well as oral nutrition supplements.  ? ?Nutrition-Focused physical exam completed. Findings are no fat depletion, no muscle depletion, and no edema.   ? ?Medications reviewed and include lactated ringers infusion @ 150 ml/hr ? ?Lab Results  ?Component Value Date  ? HGBA1C 5.9 (H) 12/24/2021  ?  PTA DM medications are none. Per wife, pt was recently diagnosed with DM (Hgb A1c: 6.4). He is supposed to be started on ozempic, but is awaiting insurance authorization.  ? ?Labs reviewed: CBGS: 127-137 (inpatient orders for glycemic control are 0-15 units insulin aspart TID with meals).   ? ?Body mass  index is 31.66 kg/m?Marland Kitchen Patient meets criteria for obesity, class I based on current BMI.  ? ?Current diet order is carb modified, patient is consuming approximately 100% of meals at this time. Labs and medications reviewed.  ? ?No nutrition interventions warranted at this time. If nutrition issues arise, please consult RD.  ? ?Loistine Chance, RD, LDN, CDCES ?Registered Dietitian II ?Certified Diabetes Care and Education Specialist ?Please refer to [facility] for RD and/or RD on-call/weekend/after hours pager   ?

## 2021-12-25 NOTE — Progress Notes (Signed)
Urology Inpatient Progress Note ? ?Subjective: ?No acute events overnight. He is afebrile, VSS. ?WBC count stable today, 11.7. On antibiotics as below. ?He is accompanied today at the bedside by his wife, who is a Marine scientist at [employer]. She notes increased penile and scrotal edema today and provides a photo which supports this. Patient reports stable pain but otherwise states he feels better than he did on admission. He is circumcised. He is voiding spontaneously and denies lower abdominal distension, pain, or the sensation of incomplete emptying. ?On further questioning, patient and wife report that he has a history of 5 abscesses in the last 9 months (2 axillary, 1 umbilical, 1 scrotal, and one right lower extremity), starting in August 2022. His first abscess was on his right lower extremity and occurred approximately 10 days after he was bitten on the ipsilateral ankle by a wahoo while sport fishing. He denies any abscesses prior and is a nonsmoker. No wound cultures available per chart review. ? ?Anti-infectives: ?Anti-infectives (From admission, onward)  ? ? Start     Dose/Rate Route Frequency Ordered Stop  ? 12/25/21 1100  vancomycin (VANCOREADY) IVPB 1500 mg/300 mL       ? 1,500 mg ?150 mL/hr over 120 Minutes Intravenous Every 24 hours 12/25/21 1014    ? 12/24/21 2200  piperacillin-tazobactam (ZOSYN) IVPB 3.375 g       ? 3.375 g ?12.5 mL/hr over 240 Minutes Intravenous Every 8 hours 12/24/21 1745    ? 12/24/21 1315  piperacillin-tazobactam (ZOSYN) IVPB 3.375 g       ? 3.375 g ?100 mL/hr over 30 Minutes Intravenous  Once 12/24/21 1311 12/24/21 1458  ? 12/24/21 1145  vancomycin (VANCOREADY) IVPB 2000 mg/400 mL       ? 2,000 mg ?200 mL/hr over 120 Minutes Intravenous  Once 12/24/21 1140 12/24/21 1555  ? ?  ? ? ?Current Facility-Administered Medications  ?Medication Dose Route Frequency Provider Last Rate Last Admin  ? acetaminophen (TYLENOL) tablet 650 mg  650 mg Oral Q6H PRN Vashti Hey, MD   650 mg at  12/25/21 0220  ? Or  ? acetaminophen (TYLENOL) suppository 650 mg  650 mg Rectal Q6H PRN Vashti Hey, MD      ? bisacodyl (DULCOLAX) EC tablet 5 mg  5 mg Oral Daily PRN Bonnell Public Tublu, MD      ? enoxaparin (LOVENOX) injection 55 mg  0.5 mg/kg Subcutaneous Q24H Bonnell Public Tublu, MD   55 mg at 12/24/21 2219  ? escitalopram (LEXAPRO) tablet 5 mg  5 mg Oral Daily Vashti Hey, MD   5 mg at 12/25/21 0846  ? fenofibrate tablet 160 mg  160 mg Oral Daily Jennye Boroughs, MD   160 mg at 12/25/21 1146  ? insulin aspart (novoLOG) injection 0-15 Units  0-15 Units Subcutaneous TID WC Vashti Hey, MD   2 Units at 12/25/21 6967  ? lactated ringers infusion   Intravenous Continuous Vashti Hey, MD 150 mL/hr at 12/25/21 0245 Restarted at 12/25/21 0245  ? morphine (PF) 2 MG/ML injection 2 mg  2 mg Intravenous Q4H PRN Bonnell Public Tublu, MD   2 mg at 12/25/21 1154  ? niacin (SLO-NIACIN) CR tablet 500 mg  500 mg Oral QHS Jennye Boroughs, MD      ? oxyCODONE (Oxy IR/ROXICODONE) immediate release tablet 5 mg  5 mg Oral Q6H PRN Jennye Boroughs, MD      ? piperacillin-tazobactam (ZOSYN) IVPB 3.375 g  3.375 g Intravenous Q8H Sherlean Foot,  Carissa E, RPH 12.5 mL/hr at 12/25/21 0506 3.375 g at 12/25/21 0506  ? polyethylene glycol (MIRALAX / GLYCOLAX) packet 17 g  17 g Oral Daily PRN Vashti Hey, MD      ? psyllium (HYDROCIL/METAMUCIL) 1 packet  1 packet Oral QHS Jennye Boroughs, MD      ? vancomycin (VANCOREADY) IVPB 1500 mg/300 mL  1,500 mg Intravenous Q24H Jennye Boroughs, MD 150 mL/hr at 12/25/21 1147 1,500 mg at 12/25/21 1147  ? ?Objective: ?Vital signs in last 24 hours: ?Temp:  [97.9 ?F (36.6 ?C)-100.1 ?F (37.8 ?C)] 98 ?F (36.7 ?C) (05/01 1138) ?Pulse Rate:  [56-135] 74 (05/01 1138) ?Resp:  [12-26] 18 (05/01 1138) ?BP: (97-120)/(56-81) 109/74 (05/01 1138) ?SpO2:  [91 %-96 %] 94 % (05/01 1138) ? ?Intake/Output from previous day: ?04/30 0701 - 05/01  0700 ?In: 5695.3 [I.V.:2135; IV Piggyback:3560.3] ?Out: 825 [Urine:825] ?Intake/Output this shift: ?No intake/output data recorded. ? ?Physical Exam ?Vitals and nursing note reviewed.  ?Constitutional:   ?   General: He is not in acute distress. ?   Appearance: He is not ill-appearing, toxic-appearing or diaphoretic.  ?HENT:  ?   Head: Normocephalic and atraumatic.  ?Pulmonary:  ?   Effort: Pulmonary effort is normal. No respiratory distress.  ?Genitourinary: ?   Comments: Scrotal edema and erythema without fluctuance or crepitus. Induration noted inferior to the abscess cavity extending to the perineum and superior to the cavity extending to the left inguinal crease. Penile edema also noted. ?Neurological:  ?   Mental Status: He is alert and oriented to person, place, and time.  ?Psychiatric:     ?   Mood and Affect: Mood normal.     ?   Behavior: Behavior normal.  ? ?Lab Results:  ?Recent Labs  ?  12/24/21 ?2030 12/25/21 ?0416  ?WBC 11.6* 11.7*  ?HGB 13.9 13.9  ?HCT 41.3 41.1  ?PLT 148* 146*  ? ?BMET ?Recent Labs  ?  12/24/21 ?1245 12/24/21 ?2030 12/25/21 ?0416  ?NA 136  --  138  ?K 4.2  --  4.1  ?CL 106  --  108  ?CO2 22  --  24  ?GLUCOSE 123*  --  128*  ?BUN 20  --  18  ?CREATININE 1.47* 1.57* 1.51*  ?CALCIUM 8.9  --  8.6*  ? ?PT/INR ?Recent Labs  ?  12/24/21 ?1245  ?LABPROT 14.5  ?INR 1.1  ? ?Assessment & Plan: ?51 year old male with a 9 month history of multiple cutaneous abscesses now admitted with scrotal cellulitis s/p I&D at urgent care. Patient is clinically improving on empiric antibiotics. ? ?Given temporal correlation of abscesses with fish bite, recommend ID consult for evaluation of possible water-borne bacterial involvement. I do not think his presentation is consistent with hidradenitis given onset of frequent abscesses in the 5th decade of life. ? ?I exchanged his iodoform gauze at the bedside today; patient tolerated well. OK to continue daily dressing changes with nursing. We discussed  premedication as needed. ? ?We discussed scrotal support including scrotal elevation, compressive underwear, and cryotherapy. ? ?Recommendations: ?-ID consult, antibiotics per recs ?-Daily dressing changes with nursing ?-Consider repeat imaging if swelling worsens ?-Scrotal elevation and cryotherapy as needed ? ?Debroah Loop, PA-C ?12/25/2021  ?

## 2021-12-25 NOTE — Progress Notes (Signed)
?   12/25/21 2030  ?Clinical Encounter Type  ?Visited With Family;Health care provider  ?Visit Type Initial  ?Consult/Referral To Other (Comment) ?(rapid response (2A))  ?Spiritual Encounters  ?Spiritual Needs Prayer;Other (Comment) ?(social support)  ? ?Chaplain Burris f/u on a rapid response earlier this evening when Pt was on 2A (chaplain was at a code blue/death). Chaplain B initially checked-in with 2A staff to assess needs and then attended to family in ICU waiting. Chaplain B offered compassionate, non-anxious presence and normalization of emotion to Pt's spouse, Tye [place]. Chaplain B let her know she would be available for support and prayer throughout the night. ?

## 2021-12-25 NOTE — Progress Notes (Addendum)
Nasal swab + MRSA PCR / MD made aware/ orders to place on contact precautions until wound cultures are back - if wound cultures are negative for MRSA  - ok to d/c isolation  ?

## 2021-12-25 NOTE — Progress Notes (Signed)
Mobility Specialist - Progress Note ? ? ? 12/25/21 1400  ?Mobility  ?Activity Ambulated independently in hallway;Stood at bedside  ?Level of Assistance Independent  ?Assistive Device None  ?Distance Ambulated (ft) 320 ft  ?Activity Response Tolerated well  ?$Mobility charge 1 Mobility  ? ? ?Post-mobility:102 HR,  88-90% SPO2 ? ?Pt standing upon arrival using RA with wife at bedside. Pt completes ambulation voicing no complaints. Pt left with needs in reach. ? ?Merrily Brittle ?Mobility Specialist ?12/25/21, 2:55 PM ? ?

## 2021-12-25 NOTE — Significant Event (Signed)
Received note for RRT on patient with respiratory distress. ?Pt on my eval at bedside is standing with nasal cpap.  ?Wife at bedside give history. ?Per nurses report pt has been admitted yesterday for GU abscess. ?Pt started on iv abx and is septic and BP has been low and has received multiple ivf resuscitation.  ?Pt has developed SOB worse over  past one hour.  ?We will transfer pt to stepdown and if SBP is low we will proceed with VP therapy and transfer to ICU. ?Wife and pt agree with plan.  ?All questions answered. ?Wife is nurse and understands that doing the CTA may worsen kidney function, but benefit is more than risk. ? ?Blood pressure 134/83, pulse 89, temperature (!) 100.5 ?F (38.1 ?C), temperature source Oral, resp. rate 18, height '6\' 1"'$  (1.854 m), weight 108.9 kg, SpO2 92 %. ?Physical Exam ?Vitals reviewed.  ?Constitutional:   ?   Appearance: He is ill-appearing.  ?   Interventions: Nasal cannula in place.  ?Cardiovascular:  ?   Rate and Rhythm: Regular rhythm. Tachycardia present.  ?   Heart sounds: Normal heart sounds.  ?Pulmonary:  ?   Breath sounds: Rales present.  ?Psychiatric:     ?   Mood and Affect: Mood normal.     ?   Behavior: Behavior normal.  ? ?Assessment and plan: ?>Acute respiratory failure with hypoxia: ?D/D include volume overload from IV fluid resuscitation, PTA, underlying congestive heart failure, ?Dehydration.  We will obtain a stat CT angio of the chest.2 d echo. Strict I/O. ? ?>Hypotension: ?Vitals:  ? 12/24/21 1700 12/24/21 1730 12/24/21 1800 12/24/21 1930  ?BP: 115/81 116/77 116/70 113/76  ? 12/24/21 2000 12/24/21 2154 12/24/21 2350 12/25/21 0510  ?BP: 120/77 116/66 118/67 97/63  ? 12/25/21 0806 12/25/21 1138 12/25/21 1541 12/25/21 1809  ?BP: 104/78 109/74 103/69 134/83  ?Transfer to stepdown and will d/w ICU team to take pt I suspect he will need VP therapy and is septic shock . ? ?>GU abcess; ?IV ABX per ID MD. ? ? ?

## 2021-12-25 NOTE — Progress Notes (Signed)
? ? ? ?Progress Note  ? ? ?Cory Johnston  WUJ:[id number] DOB: 1971/05/23  DOA: 12/24/2021 ?PCP: Adin Hector, MD  ? ? ? ? ?Brief Narrative:  ? ? ?Medical records reviewed and are as summarized below: ? ?Cory Johnston is a 51 y.o. male with medical history significant for prediabetes with hemoglobin A1c of 6.4 and recently prescribed Ozempic (yet to start treatment), prehypertension, acute diverticulitis, fatty liver, gout, anxiety, hyperlipidemia, nephrolithiasis, extracorporeal shockwave lithotripsy left UPJ stone, OSA on CPAP, actinic keratosis. He has a history of recurrent skin infections/abscesses ( history of 5 abscesses since last August (2 axillary, 1 umbilical, 1 scrotal, one lower extremity  He says the leg abscess happened first and started about 10 days after he was bitten on the ankle (same side) by a wahoo while deep sea fishing.  He noticed a small bump in the scrotum about 3 days prior to admission.  He initially went to the urgent care center for painful swelling of the scrotum where he had an I&D.  Subsequently, he presented to the hospital because of increasing pain and swelling in the scrotum.  He developed fevers and chills the day prior to admission.  He was febrile in the emergency room with temperature of 102.4 ?F. ? ?He was found to have severe sepsis secondary to scrotal abscess.  He underwent I&D of scrotal abscess in the emergency room.  He was treated with empiric IV antibiotics and IV fluids. ? ? ? ? ? ? ?Assessment/Plan:  ? ?Principal Problem: ?  Severe sepsis ([place]) ?Active Problems: ?  Scrotal abscess ? ? ?Body mass index is 31.66 kg/m?.  (Obesity) ? ? ?Severe sepsis secondary to right scrotal abscess: S/p I&D of right scrotal abscess in the ED on 12/24/2021.  Discontinue IV fluids.  Continue IV Zosyn.  Analgesics as needed for pain.  MRSA PCR growing has been ordered.  Consulted ID to assist with management because of history of multiple skin abscesses and history of wahoo bite  while deep sea fishing. ? ?AKI: Suspect underlying CKD stage II or stage IIIa.  Monitor BMP closely. ? ?Hyperlipidemia/hypertriglyceridemia: Continue fenofibrate and niacin ? ?Use NovoLog as needed for hyperglycemia ? ?Other comorbidities include prediabetes, hypertension, anxiety, fatty liver ? ? ?Diet Order   ? ?       ?  Diet Carb Modified Fluid consistency: Thin; Room service appropriate? Yes  Diet effective now       ?  ? ?  ?  ? ?  ? ? ? ? ? ? ? ?Consultants: ?Urologist ?Infectious disease ? ?Procedures: ?I&D of right scrotal abscess ? ? ? ?Medications:  ? ? enoxaparin (LOVENOX) injection  0.5 mg/kg Subcutaneous Q24H  ? escitalopram  5 mg Oral Daily  ? fenofibrate  160 mg Oral Daily  ? insulin aspart  0-15 Units Subcutaneous TID WC  ? niacin  500 mg Oral QHS  ? psyllium  1 packet Oral QHS  ? ?Continuous Infusions: ? lactated ringers 150 mL/hr at 12/25/21 0245  ? piperacillin-tazobactam (ZOSYN)  IV 3.375 g (12/25/21 0506)  ? vancomycin 1,500 mg (12/25/21 1147)  ? ? ? ?Anti-infectives (From admission, onward)  ? ? Start     Dose/Rate Route Frequency Ordered Stop  ? 12/25/21 1100  vancomycin (VANCOREADY) IVPB 1500 mg/300 mL       ? 1,500 mg ?150 mL/hr over 120 Minutes Intravenous Every 24 hours 12/25/21 1014    ? 12/24/21 2200  piperacillin-tazobactam (ZOSYN) IVPB 3.375 g       ?  3.375 g ?12.5 mL/hr over 240 Minutes Intravenous Every 8 hours 12/24/21 1745    ? 12/24/21 1315  piperacillin-tazobactam (ZOSYN) IVPB 3.375 g       ? 3.375 g ?100 mL/hr over 30 Minutes Intravenous  Once 12/24/21 1311 12/24/21 1458  ? 12/24/21 1145  vancomycin (VANCOREADY) IVPB 2000 mg/400 mL       ? 2,000 mg ?200 mL/hr over 120 Minutes Intravenous  Once 12/24/21 1140 12/24/21 1555  ? ?  ? ? ? ? ? ? ? ? ? ?Family Communication/Anticipated D/C date and plan/Code Status  ? ?DVT prophylaxis:  ? ? ?  Code Status: Full Code ? ?Family Communication: Plan discussed with his wife at the bedside ?Disposition Plan: Plan to discharge home in 1 to 2  days ? ? ?Status is: Inpatient ?Remains inpatient appropriate because: IV antibiotics ? ? ? ? ? ? ?Subjective:  ? ?Interval events noted.  He complains of pain in the right scrotum.  His wife was at the bedside.  He has had multiple skin infections/abscesses in the past. ? ?Objective:  ? ? ?Vitals:  ? 12/25/21 0400 12/25/21 0510 12/25/21 0806 12/25/21 1138  ?BP:  97/63 104/78 109/74  ?Pulse: (!) 56 60 72 74  ?Resp: '16 16 18 18  '$ ?Temp:  97.9 ?F (36.6 ?C) 98.2 ?F (36.8 ?C) 98 ?F (36.7 ?C)  ?TempSrc:  Oral    ?SpO2: 96% 94% 95% 94%  ?Weight:      ?Height:      ? ?No data found. ? ? ?Intake/Output Summary (Last 24 hours) at 12/25/2021 1202 ?Last data filed at 12/25/2021 0600 ?Gross per 24 hour  ?Intake 5695.29 ml  ?Output 825 ml  ?Net 4870.29 ml  ? ?Filed Weights  ? 12/24/21 1113  ?Weight: 108.9 kg  ? ? ?Exam: ? ?GEN: NAD ?SKIN: No rash ?EYES: EOMI ?ENT: MMM ?CV: RRR ?PULM: CTA B ?ABD: soft, obese, NT, +BS ?CNS: AAO x 3, non focal ?EXT: No edema or tenderness ?GU: Right scrotum incisional wound has been packed with gauze.  No active drainage noted.  Some tenderness and induration about the right scrotum ? ? ? ?  ? ? ?Data Reviewed:  ? ?I have personally reviewed following labs and imaging studies: ? ?Labs: ?Labs show the following:  ? ?Basic Metabolic Panel: ?Recent Labs  ?Lab 12/24/21 ?1245 12/24/21 ?2030 12/25/21 ?0416  ?NA 136  --  138  ?K 4.2  --  4.1  ?CL 106  --  108  ?CO2 22  --  24  ?GLUCOSE 123*  --  128*  ?BUN 20  --  18  ?CREATININE 1.47* 1.57* 1.51*  ?CALCIUM 8.9  --  8.6*  ? ?GFR ?Estimated Creatinine Clearance: 74.1 mL/min (A) (by C-G formula based on SCr of 1.51 mg/dL (H)). ?Liver Function Tests: ?Recent Labs  ?Lab 12/24/21 ?1245 12/25/21 ?0416  ?AST 23 61*  ?ALT 29 72*  ?ALKPHOS 36* 38  ?BILITOT 1.3* 0.9  ?PROT 6.6 6.0*  ?ALBUMIN 3.7 3.2*  ? ?No results for input(s): LIPASE, AMYLASE in the last 168 hours. ?No results for input(s): AMMONIA in the last 168 hours. ?Coagulation profile ?Recent Labs  ?Lab  12/24/21 ?1245  ?INR 1.1  ? ? ?CBC: ?Recent Labs  ?Lab 12/24/21 ?1245 12/24/21 ?2030 12/25/21 ?0416  ?WBC 12.8* 11.6* 11.7*  ?NEUTROABS 10.8*  --   --   ?HGB 15.4 13.9 13.9  ?HCT 45.4 41.3 41.1  ?MCV 85.7 87.9 88.2  ?PLT 176 148* 146*  ? ?Cardiac  Enzymes: ?No results for input(s): CKTOTAL, CKMB, CKMBINDEX, TROPONINI in the last 168 hours. ?BNP (last 3 results) ?No results for input(s): PROBNP in the last 8760 hours. ?CBG: ?Recent Labs  ?Lab 12/24/21 ?2201 12/25/21 ?0807 12/25/21 ?1139  ?GLUCAP 137* 127* 117*  ? ?D-Dimer: ?No results for input(s): DDIMER in the last 72 hours. ?Hgb A1c: ?Recent Labs  ?  12/24/21 ?2030  ?HGBA1C 5.9*  ? ?Lipid Profile: ?No results for input(s): CHOL, HDL, LDLCALC, TRIG, CHOLHDL, LDLDIRECT in the last 72 hours. ?Thyroid function studies: ?No results for input(s): TSH, T4TOTAL, T3FREE, THYROIDAB in the last 72 hours. ? ?Invalid input(s): FREET3 ?Anemia work up: ?No results for input(s): VITAMINB12, FOLATE, FERRITIN, TIBC, IRON, RETICCTPCT in the last 72 hours. ?Sepsis Labs: ?Recent Labs  ?Lab 12/24/21 ?1137 12/24/21 ?1245 12/24/21 ?2030 12/25/21 ?0416  ?WBC  --  12.8* 11.6* 11.7*  ?LATICACIDVEN 1.6 1.0  --   --   ? ? ?Microbiology ?Recent Results (from the past 240 hour(s))  ?Blood Culture (routine x 2)     Status: None (Preliminary result)  ? Collection Time: 12/24/21 12:46 PM  ? Specimen: BLOOD  ?Result Value Ref Range Status  ? Specimen Description BLOOD RIGHT ANTECUBITAL  Final  ? Special Requests   Final  ?  BOTTLES DRAWN AEROBIC AND ANAEROBIC Blood Culture results may not be optimal due to an excessive volume of blood received in culture bottles  ? Culture   Final  ?  NO GROWTH < 24 HOURS ?Performed at [facility], [address] [place], [place] [address] ?  ? Report Status PENDING  Incomplete  ?Blood Culture (routine x 2)     Status: None (Preliminary result)  ? Collection Time: 12/24/21 12:52 PM  ? Specimen: BLOOD  ?Result Value Ref Range Status  ? Specimen  Description BLOOD LEFT ANTECUBITAL  Final  ? Special Requests   Final  ?  BOTTLES DRAWN AEROBIC AND ANAEROBIC Blood Culture adequate volume  ? Culture   Final  ?  NO GROWTH < 24 HOURS ?Performed at [facility],

## 2021-12-25 NOTE — Consult Note (Signed)
NAME: Cory Johnston  DOB: 24-Apr-1971  MRN: [id number]  Date/Time: 12/25/2021 12:28 PM  REQUESTING PROVIDER: Garrel Ridgel Subjective:  REASON FOR CONSULT: scrotal infection ? Cory Johnston is a 51 y.o. male with a history of HTN, sleep apnea   5 days ago had a nodule on the scrotum like a chigger bite which got worse, [facility] and was sent to the ED HE also had fever and chills PT says he has had recurrent folliculitis /infected nodules since the past year Was bit by a wahoo fish aug 2022 and 10 days later had a wound on the left ankle /achilles tendon area which resolved with antibiotics.       Past Medical History:  Diagnosis Date   Acute diverticulitis 10/15/2017   Anxiety    Complication of anesthesia    Elevated ferritin 08/04/2015   Fatty liver    Gout    hx of   Hallux valgus of left foot    History of actinic keratosis 05/14/2016   left post auricular neck, bx proven   Hyperlipemia, mixed    Hypertension    controlled on meds   Nephrolithiasis    PONV (postoperative nausea and vomiting)    Sleep apnea    cpap    Past Surgical History:  Procedure Laterality Date   AIKEN OSTEOTOMY Left 02/01/2016   Procedure: Quintella Reichert OSTEOTOMY [place] LEFT 1ST METATARSAL;  Surgeon: Gwyneth Revels, DPM;  Location: [facility];  Service: Podiatry;  Laterality: Left;  WITH POPLITEAL CPAP   ANKLE FRACTURE SURGERY Left    COLONOSCOPY WITH PROPOFOL N/A 01/23/2018   Procedure: COLONOSCOPY WITH PROPOFOL;  Surgeon: Midge Minium, MD;  Location: [facility];  Service: Endoscopy;  Laterality: N/A;   EXTRACORPOREAL SHOCK WAVE LITHOTRIPSY Left 10/20/2020   Procedure: EXTRACORPOREAL SHOCK WAVE LITHOTRIPSY (ESWL);  Surgeon: Sondra Come, MD;  Location: [facility];  Service: Urology;  Laterality: Left;   POLYPECTOMY N/A 01/23/2018   Procedure: POLYPECTOMY;  Surgeon: Midge Minium, MD;  Location: [facility];  Service: Endoscopy;  Laterality: N/A;   TONSILLECTOMY AND  ADENOIDECTOMY     TRIGGER FINGER RELEASE     VASECTOMY      Social History   Socioeconomic History   Marital status: Divorced    Spouse name: Not on file   Number of children: Not on file   Years of education: Not on file   Highest education level: Not on file  Occupational History   Not on file  Tobacco Use   Smoking status: Never   Smokeless tobacco: Never  Vaping Use   Vaping Use: Never used  Substance and Sexual Activity   Alcohol use: Not Currently    Alcohol/week: 14.0 standard drinks    Types: 12 Cans of beer, 2 Shots of liquor per week    Comment: 3 x week beer and liquor   Drug use: No   Sexual activity: Not on file  Other Topics Concern   Not on file  Social History Narrative   Not on file   Social Determinants of Health   Financial Resource Strain: Not on file  Food Insecurity: Not on file  Transportation Needs: Not on file  Physical Activity: Not on file  Stress: Not on file  Social Connections: Not on file  Intimate Partner Violence: Not on file    Family History  Problem Relation Age of Onset   Hypertension Father    Stroke Father    CAD Father  Hyperlipidemia Father    Diabetes Father    Stroke Paternal Aunt    No Known Allergies I? Current Facility-Administered Medications  Medication Dose Route Frequency Provider Last Rate Last Admin   acetaminophen (TYLENOL) tablet 650 mg  650 mg Oral Q6H PRN Pieter Partridge, MD   650 mg at 12/25/21 4098   Or   acetaminophen (TYLENOL) suppository 650 mg  650 mg Rectal Q6H PRN Pieter Partridge, MD       bisacodyl (DULCOLAX) EC tablet 5 mg  5 mg Oral Daily PRN Leandro Reasoner Tublu, MD       enoxaparin (LOVENOX) injection 55 mg  0.5 mg/kg Subcutaneous Q24H Leandro Reasoner Tublu, MD   55 mg at 12/24/21 2219   escitalopram (LEXAPRO) tablet 5 mg  5 mg Oral Daily Leandro Reasoner Tublu, MD   5 mg at 12/25/21 0846   fenofibrate tablet 160 mg  160 mg Oral Daily Lurene Shadow, MD    160 mg at 12/25/21 1146   insulin aspart (novoLOG) injection 0-15 Units  0-15 Units Subcutaneous TID WC Pieter Partridge, MD   2 Units at 12/25/21 1191   lactated ringers infusion   Intravenous Continuous Leandro Reasoner Tublu, MD 150 mL/hr at 12/25/21 0245 Restarted at 12/25/21 0245   morphine (PF) 2 MG/ML injection 2 mg  2 mg Intravenous Q4H PRN Pieter Partridge, MD   2 mg at 12/25/21 1154   niacin (SLO-NIACIN) CR tablet 500 mg  500 mg Oral QHS Lurene Shadow, MD       oxyCODONE (Oxy IR/ROXICODONE) immediate release tablet 5 mg  5 mg Oral Q6H PRN Lurene Shadow, MD       piperacillin-tazobactam (ZOSYN) IVPB 3.375 g  3.375 g Intravenous Q8H Sharen Hones, RPH 12.5 mL/hr at 12/25/21 0506 3.375 g at 12/25/21 0506   polyethylene glycol (MIRALAX / GLYCOLAX) packet 17 g  17 g Oral Daily PRN Pieter Partridge, MD       psyllium (HYDROCIL/METAMUCIL) 1 packet  1 packet Oral QHS Lurene Shadow, MD       vancomycin (VANCOREADY) IVPB 1500 mg/300 mL  1,500 mg Intravenous Q24H Lurene Shadow, MD 150 mL/hr at 12/25/21 1147 1,500 mg at 12/25/21 1147     Abtx:  Anti-infectives (From admission, onward)    Start     Dose/Rate Route Frequency Ordered Stop   12/25/21 1100  vancomycin (VANCOREADY) IVPB 1500 mg/300 mL        1,500 mg 150 mL/hr over 120 Minutes Intravenous Every 24 hours 12/25/21 1014     12/24/21 2200  piperacillin-tazobactam (ZOSYN) IVPB 3.375 g        3.375 g 12.5 mL/hr over 240 Minutes Intravenous Every 8 hours 12/24/21 1745     12/24/21 1315  piperacillin-tazobactam (ZOSYN) IVPB 3.375 g        3.375 g 100 mL/hr over 30 Minutes Intravenous  Once 12/24/21 1311 12/24/21 1458   12/24/21 1145  vancomycin (VANCOREADY) IVPB 2000 mg/400 mL        2,000 mg 200 mL/hr over 120 Minutes Intravenous  Once 12/24/21 1140 12/24/21 1555       REVIEW OF SYSTEMS:  Const:  fever,  chills, negative weight loss Eyes: negative diplopia or visual changes, negative eye  pain ENT: negative coryza, negative sore throat Resp: negative cough, hemoptysis, dyspnea Cards: negative for chest pain, palpitations, lower extremity edema GU: negative for frequency, dysuria and hematuria GI: Negative for abdominal pain, diarrhea, bleeding, constipation Skin:painful swelling scrotum Heme: negative  for easy bruising and gum/nose bleeding MS: negative for myalgias, arthralgias, back pain and muscle weakness Neurolo:negative for headaches, dizziness, vertigo, memory problems  Psych:  anxiety Endocrine: negative for thyroid, diabetes Allergy/Immunology- negative for any medication or food allergies ? Objective:  VITALS:  BP 109/74 (BP Location: Right Arm)   Pulse 74   Temp 98 F (36.7 C)   Resp 18   Ht 6\' 1"  (1.854 m)   Wt 108.9 kg   SpO2 94%   BMI 31.66 kg/m  PHYSICAL EXAM:  General: Alert, cooperative, feels cold, appears stated age.  Head: Normocephalic, without obvious abnormality, atraumatic. Eyes: Conjunctivae clear, anicteric sclerae. Pupils are equal ENT Nares normal. No drainage or sinus tenderness. Lips, mucosa, and tongue normal. No Thrush Neck: Supple, symmetrical, no adenopathy, thyroid: non tender no carotid bruit and no JVD. Back: No CVA tenderness. Lungs: Clear to auscultation bilaterally. No Wheezing or Rhonchi. No rales. Heart: Regular rate and rhythm, no murmur, rub or gallop. Abdomen: Soft, non-tender,not distended. Bowel sounds normal. No masses Scrotum Erythematous, some induration on the left side. Small I/D site-   Extremities: atraumatic, no cyanosis. No edema. No clubbing Skin: flushed over face Lymph: Cervical, supraclavicular normal. Neurologic: Grossly non-focal Pertinent Labs Lab Results CBC    Component Value Date/Time   WBC 11.7 (H) 12/25/2021 0416   RBC 4.66 12/25/2021 0416   HGB 13.9 12/25/2021 0416   HCT 41.1 12/25/2021 0416   PLT 146 (L) 12/25/2021 0416   MCV 88.2 12/25/2021 0416   MCH 29.8 12/25/2021 0416    MCHC 33.8 12/25/2021 0416   RDW 13.4 12/25/2021 0416   LYMPHSABS 0.8 12/24/2021 1245   MONOABS 0.9 12/24/2021 1245   EOSABS 0.2 12/24/2021 1245   BASOSABS 0.0 12/24/2021 1245       Latest Ref Rng & Units 12/25/2021    4:16 AM 12/24/2021    8:30 PM 12/24/2021   12:45 PM  CMP  Glucose 70 - 99 mg/dL 295    621    BUN 6 - 20 mg/dL 18    20    Creatinine 0.61 - 1.24 mg/dL 3.08   6.57   8.46    Sodium 135 - 145 mmol/L 138    136    Potassium 3.5 - 5.1 mmol/L 4.1    4.2    Chloride 98 - 111 mmol/L 108    106    CO2 22 - 32 mmol/L 24    22    Calcium 8.9 - 10.3 mg/dL 8.6    8.9    Total Protein 6.5 - 8.1 g/dL 6.0    6.6    Total Bilirubin 0.3 - 1.2 mg/dL 0.9    1.3    Alkaline Phos 38 - 126 U/L 38    36    AST 15 - 41 U/L 61    23    ALT 0 - 44 U/L 72    29        Microbiology: Recent Results (from the past 240 hour(s))  Blood Culture (routine x 2)     Status: None (Preliminary result)   Collection Time: 12/24/21 12:46 PM   Specimen: BLOOD  Result Value Ref Range Status   Specimen Description BLOOD RIGHT ANTECUBITAL  Final   Special Requests   Final    BOTTLES DRAWN AEROBIC AND ANAEROBIC Blood Culture results may not be optimal due to an excessive volume of blood received in culture bottles   Culture   Final  NO GROWTH < 24 HOURS Performed at [facility], [address] [place], [place] [address]    Report Status PENDING  Incomplete  Blood Culture (routine x 2)     Status: None (Preliminary result)   Collection Time: 12/24/21 12:52 PM   Specimen: BLOOD  Result Value Ref Range Status   Specimen Description BLOOD LEFT ANTECUBITAL  Final   Special Requests   Final    BOTTLES DRAWN AEROBIC AND ANAEROBIC Blood Culture adequate volume   Culture   Final    NO GROWTH < 24 HOURS Performed at [facility], [address], [place], [place] [address]    Report Status PENDING  Incomplete  Resp Panel by RT-PCR (Flu A&B, Covid) Nasopharyngeal Swab     Status:  None   Collection Time: 12/24/21  1:45 PM   Specimen: Nasopharyngeal Swab; Nasopharyngeal(NP) swabs in vial transport medium  Result Value Ref Range Status   SARS Coronavirus 2 by RT PCR NEGATIVE NEGATIVE Final    Comment: (NOTE) SARS-CoV-2 target nucleic acids are NOT DETECTED.  The SARS-CoV-2 RNA is generally detectable in upper respiratory specimens during the acute phase of infection. The lowest concentration of SARS-CoV-2 viral copies this assay can detect is 138 copies/mL. A negative result does not preclude SARS-Cov-2 infection and should not be used as the sole basis for treatment or other patient management decisions. A negative result may occur with  improper specimen collection/handling, submission of specimen other than nasopharyngeal swab, presence of viral mutation(s) within the areas targeted by this assay, and inadequate number of viral copies(<138 copies/mL). A negative result must be combined with clinical observations, patient history, and epidemiological information. The expected result is Negative.  Fact Sheet for Patients:  BloggerCourse.com  Fact Sheet for Healthcare Providers:  SeriousBroker.it  This test is no t yet approved or cleared by the [place] FDA and  has been authorized for detection and/or diagnosis of SARS-CoV-2 by FDA under an Emergency Use Authorization (EUA). This EUA will remain  in effect (meaning this test can be used) for the duration of the COVID-19 declaration under Section 564(b)(1) of the Act, 21 U.S.C.section 360bbb-3(b)(1), unless the authorization is terminated  or revoked sooner.       Influenza A by PCR NEGATIVE NEGATIVE Final   Influenza B by PCR NEGATIVE NEGATIVE Final    Comment: (NOTE) The Xpert Xpress SARS-CoV-2/FLU/RSV plus assay is intended as an aid in the diagnosis of influenza from Nasopharyngeal swab specimens and should not be used as a sole basis for  treatment. Nasal washings and aspirates are unacceptable for Xpert Xpress SARS-CoV-2/FLU/RSV testing.  Fact Sheet for Patients: BloggerCourse.com  Fact Sheet for Healthcare Providers: SeriousBroker.it  This test is not yet approved or cleared by the [place] FDA and has been authorized for detection and/or diagnosis of SARS-CoV-2 by FDA under an Emergency Use Authorization (EUA). This EUA will remain in effect (meaning this test can be used) for the duration of the COVID-19 declaration under Section 564(b)(1) of the Act, 21 U.S.C. section 360bbb-3(b)(1), unless the authorization is terminated or revoked.  Performed at [facility], [address] [place], [place] [address]     IMAGING RESULTS: Moderate inflammation and ill defined fluid within posterior/inferior scrotum I have personally reviewed the films ? Impression/Recommendation Scrotal infection- scrotal wall redness and swelling started like folliculitis and now has induration - need to r/o MRSA/staph infection especially with h/o recurrent folliculitis and abscesses CT done from yesterday showed ill defined  fluid- no obvious cllection Need to watch closely as he may likely need I/D Currently on vanco and zosyn- change the vanco to linezolid Culture taken from the small I/D site  Past h/o left leg infection after a bite from [employer]-  Doubt this is an atypical mycobacteria ( m.marinum) infection   ? ?Anxiety on escitalopram 5mg   Hyperglycemia diagnosed this admission  - on insulin  AKI - avoid vanco /zosyn combo- will DC vanco and change to linezolid Pt seen with urology provider Lelon Mast V ___________________________________________________ Discussed with patient, wife, nurse and requesting provider Note:  This document was prepared using Dragon voice recognition software and may include unintentional dictation errors.

## 2021-12-26 ENCOUNTER — Encounter: Payer: Self-pay | Admitting: Internal Medicine

## 2021-12-26 ENCOUNTER — Inpatient Hospital Stay (HOSPITAL_COMMUNITY)
Admit: 2021-12-26 | Discharge: 2021-12-26 | Disposition: A | Discharge status: Home / Self Care | Payer: BC Managed Care – PPO | Attending: Internal Medicine | Admitting: Internal Medicine

## 2021-12-26 DIAGNOSIS — J9601 Acute respiratory failure with hypoxia: Secondary | ICD-10-CM | POA: Diagnosis not present

## 2021-12-26 DIAGNOSIS — J810 Acute pulmonary edema: Secondary | ICD-10-CM

## 2021-12-26 DIAGNOSIS — I5031 Acute diastolic (congestive) heart failure: Secondary | ICD-10-CM

## 2021-12-26 DIAGNOSIS — N492 Inflammatory disorders of scrotum: Secondary | ICD-10-CM | POA: Diagnosis not present

## 2021-12-26 DIAGNOSIS — A419 Sepsis, unspecified organism: Secondary | ICD-10-CM | POA: Diagnosis not present

## 2021-12-26 DIAGNOSIS — R739 Hyperglycemia, unspecified: Secondary | ICD-10-CM

## 2021-12-26 DIAGNOSIS — F419 Anxiety disorder, unspecified: Secondary | ICD-10-CM

## 2021-12-26 DIAGNOSIS — R6520 Severe sepsis without septic shock: Secondary | ICD-10-CM | POA: Diagnosis not present

## 2021-12-26 LAB — ECHOCARDIOGRAM COMPLETE
AR max vel: 3.12 cm2
AV Area VTI: 3.09 cm2
AV Area mean vel: 2.87 cm2
AV Mean grad: 5 mmHg
AV Peak grad: 7.8 mmHg
Ao pk vel: 1.4 m/s
Area-P 1/2: 4.06 cm2
Height: 73 in
MV VTI: 2.74 cm2
S' Lateral: 2.32 cm
Weight: 4084.68 oz

## 2021-12-26 LAB — GLUCOSE, CAPILLARY
Glucose-Capillary: 158 mg/dL — ABNORMAL HIGH (ref 70–99)
Glucose-Capillary: 153 mg/dL — ABNORMAL HIGH (ref 70–99)
Glucose-Capillary: 123 mg/dL — ABNORMAL HIGH (ref 70–99)

## 2021-12-26 LAB — CBC
HCT: 41.6 % (ref 39.0–52.0)
Hemoglobin: 14.2 g/dL (ref 13.0–17.0)
MCH: 29.6 pg (ref 26.0–34.0)
MCHC: 34.1 g/dL (ref 30.0–36.0)
MCV: 86.8 fL (ref 80.0–100.0)
Platelets: 155 10*3/uL (ref 150–400)
RBC: 4.79 MIL/uL (ref 4.22–5.81)
RDW: 13.3 % (ref 11.5–15.5)
WBC: 12 10*3/uL — ABNORMAL HIGH (ref 4.0–10.5)
nRBC: 0 % (ref 0.0–0.2)

## 2021-12-26 LAB — BASIC METABOLIC PANEL
Anion gap: 12 (ref 5–15)
BUN: 17 mg/dL (ref 6–20)
CO2: 23 mmol/L (ref 22–32)
Calcium: 8.2 mg/dL — ABNORMAL LOW (ref 8.9–10.3)
Chloride: 101 mmol/L (ref 98–111)
Creatinine, Ser: 1.53 mg/dL — ABNORMAL HIGH (ref 0.61–1.24)
GFR, Estimated: 54 mL/min — ABNORMAL LOW (ref >60)
Glucose, Bld: 92 mg/dL (ref 70–99)
Potassium: 3.6 mmol/L (ref 3.5–5.1)
Sodium: 136 mmol/L (ref 135–145)

## 2021-12-26 LAB — URINE CULTURE: Culture: NO GROWTH

## 2021-12-26 LAB — ACID FAST SMEAR (AFB, MYCOBACTERIA): Acid Fast Smear: NEGATIVE

## 2021-12-26 LAB — MAGNESIUM: Magnesium: 2 mg/dL (ref 1.7–2.4)

## 2021-12-26 LAB — TROPONIN I (HIGH SENSITIVITY): Troponin I (High Sensitivity): 27 ng/L — ABNORMAL HIGH (ref <18)

## 2021-12-26 MED ORDER — PANTOPRAZOLE SODIUM 40 MG PO TBEC
40.0000 mg | DELAYED_RELEASE_TABLET | Freq: Two times a day (BID) | ORAL | Status: DC
  Administered 2021-12-26 – 2021-12-27 (×3): 40 mg via ORAL
  Filled 2021-12-26 (×3): qty 1

## 2021-12-26 MED ORDER — POTASSIUM CHLORIDE CRYS ER 20 MEQ PO TBCR
40.0000 meq | EXTENDED_RELEASE_TABLET | Freq: Once | ORAL | Status: AC
  Administered 2021-12-26: 40 meq via ORAL
  Filled 2021-12-26: qty 2

## 2021-12-26 MED ORDER — FUROSEMIDE 10 MG/ML IJ SOLN
20.0000 mg | Freq: Once | INTRAMUSCULAR | Status: AC
  Administered 2021-12-26: 20 mg via INTRAVENOUS
  Filled 2021-12-26: qty 2

## 2021-12-26 NOTE — Progress Notes (Addendum)
? ?  Date of Admission:  12/24/2021   T ?ID: MONTGOMERY FAVOR is a 51 y.o. male Principal Problem: ?  Severe sepsis ([place]) ?Active Problems: ?  Scrotal abscess ? ?Events of last evening noted when patient was short of breath and having chest pressure and also low-grade fever. ?He had CTA of the chest which ruled out PE ?He was transferred to the ICU and received furosemide, morphine and nitroglycerin and he improved ?Patient is now out of ICU to the regular floor ?Subjective: ?Wife at bedside ?Doing much better ?Scrotal swelling better ? ?Medications:  ? Chlorhexidine Gluconate Cloth  6 each Topical Q0600  ? enoxaparin (LOVENOX) injection  0.5 mg/kg Subcutaneous Q24H  ? escitalopram  5 mg Oral Daily  ? fenofibrate  160 mg Oral Daily  ? insulin aspart  0-15 Units Subcutaneous TID WC  ? niacin  500 mg Oral QHS  ? pantoprazole  40 mg Oral BID  ? psyllium  1 packet Oral QHS  ? sucralfate  1 g Oral TID WC & HS  ? ? ?Objective: ?Vital signs in last 24 hours: ?Temp:  [97.8 ?F (36.6 ?C)-100.5 ?F (38.1 ?C)] 98 ?F (36.7 ?C) (05/02 1200) ?Pulse Rate:  [57-93] 64 (05/02 1200) ?Resp:  [13-22] 21 (05/02 1200) ?BP: (100-134)/(67-83) 112/70 (05/02 1200) ?SpO2:  [91 %-100 %] 92 % (05/02 1200) ?Weight:  [115.8 kg] 115.8 kg (05/01 2050) ? ?PHYSICAL EXAM:  ?General: Alert, cooperative, no distress, appears stated age.  ?Lungs: Bilateral air entry. ?Crepitations in the bases ?Heart: Regular rate and rhythm, no murmur, rub or gallop. ?Abdomen: Soft, non-tender,not distended. Bowel sounds normal. No masses ?Scrotum erythematous ?Less swollen ?Soft except for an area on the left inguinal that has some induration ? ? ? ? ?Extremities: atraumatic, no cyanosis. No edema. No clubbing ?Skin: No rashes or lesions. Or bruising ?Lymph: Cervical, supraclavicular normal. ?Neurologic: Grossly non-focal ? ?Lab Results ?Recent Labs  ?  12/25/21 ?0416 12/25/21 ?1901 12/26/21 ?0329  ?WBC 11.7*  --  12.0*  ?HGB 13.9  --  14.2  ?HCT 41.1  --  41.6  ?NA 138 135  136  ?K 4.1 3.5 3.6  ?CL 108 100 101  ?CO2 24 21* 23  ?BUN '18 17 17  '$ ?CREATININE 1.51* 1.46* 1.53*  ? ?Liver Panel ?Recent Labs  ?  12/24/21 ?1245 12/25/21 ?0416  ?PROT 6.6 6.0*  ?ALBUMIN 3.7 3.2*  ?AST 23 61*  ?ALT 29 72*  ?ALKPHOS 36* 38  ?BILITOT 1.3* 0.9  ? ?Microbiology: ?12/25/2021 ?Scrotal wound culture ?Staphylococcus aureus.  Susceptibility pending.   ?12/24/2021 blood culture no growth ?Studies/Results: ?I have personally reviewed the films ?Development of fairly extensive bilateral interstitial and streaky pulmonary opacities suggestive of edema ? ?Assessment/Plan: ? ?Scrotal cellulitis and soft tissue infection secondary to Staphylococcus aureus ?Susceptibility pending ?Currently on linezolid and Zosyn.  We will discontinue Zosyn. ? ?Acute hypoxia secondary to pulmonary edema- much better with diuretics ? ?Past h/o left leg infection after a bite from [employer]-  ?Doubt this is an atypical mycobacteria ( m.marinum) infection  ?  ?? ?Anxiety on escitalopram '5mg'$  ?  ?Hyperglycemia diagnosed this admission  - on insulin ?  ?AKI -  ? ?Discussed the management with the patient, wife and care team ? ? ? ? ? ? ?  ?

## 2021-12-26 NOTE — Progress Notes (Incomplete)
Report called for patient to go to room 213.   ?

## 2021-12-26 NOTE — Plan of Care (Signed)

## 2021-12-26 NOTE — Progress Notes (Signed)
PHARMACIST - PHYSICIAN COMMUNICATION ? ?CONCERNING: IV to Oral Route Change Policy ? ?RECOMMENDATION: ?This patient is receiving pantoprazole by the intravenous route.  Based on criteria approved by the Pharmacy and Therapeutics Committee, the intravenous medication(s) is/are being converted to the equivalent oral dose form(s). ? ? ?DESCRIPTION: ?These criteria include: ?The patient is eating (either orally or via tube) and/or has been taking other orally administered medications for a least 24 hours ?The patient has no evidence of active gastrointestinal bleeding or impaired GI absorption (gastrectomy, short bowel, patient on TNA or NPO). ? ?If you have questions about this conversion, please contact the Pharmacy Department  ? ?Benita Gutter, RPH ?12/26/2021 9:08 AM  ?

## 2021-12-26 NOTE — TOC Initial Note (Signed)
Transition of Care (TOC) - Initial/Assessment Note  ? ? ?Patient Details  ?Name: Cory Johnston ?MRN: [id number] ?Date of Birth: Aug 19, 1971 ? ?Transition of Care (TOC) CM/SW Contact:    ?Shelbie Hutching, RN ?Phone Number: ?12/26/2021, 11:51 AM ? ?Clinical Narrative:                 ? ?Transition of Care (TOC) Screening Note ? ? ?Patient Details  ?Name: Cory Johnston ?Date of Birth: Jan 02, 1971 ? ? ?Transition of Care (TOC) CM/SW Contact:    ?Shelbie Hutching, RN ?Phone Number: ?12/26/2021, 11:51 AM ? ? ? ?Transition of Care Department [facility]) has reviewed patient and no TOC needs have been identified at this time. We will continue to monitor patient advancement through interdisciplinary progression rounds. If new patient transition needs arise, please place a TOC consult. ?  ? ?Expected Discharge Plan: Home/Self Care ?Barriers to Discharge: Continued Medical Work up ? ? ?Patient Goals and CMS Choice ?  ?  ?  ? ?Expected Discharge Plan and Services ?Expected Discharge Plan: Home/Self Care ?  ?  ?  ?Living arrangements for the past 2 months: [place] ?                ?DME Arranged: N/A ?DME Agency: NA ?  ?  ?  ?HH Arranged: NA ?[place] Agency: NA ?  ?  ?  ? ?Prior Living Arrangements/Services ?Living arrangements for the past 2 months: [place] ?Lives with:: Spouse ?  ?Do you feel safe going back to the place where you live?: Yes      ?  ?  ?  ?  ? ?Activities of Daily Living ?Home Assistive Devices/Equipment: None ?ADL Screening (condition at time of admission) ?Patient's cognitive ability adequate to safely complete daily activities?: Yes ?Is the patient deaf or have difficulty hearing?: No ?Does the patient have difficulty seeing, even when wearing glasses/contacts?: No ?Does the patient have difficulty concentrating, remembering, or making decisions?: No ?Patient able to express need for assistance with ADLs?: Yes ?Does the patient have difficulty dressing or bathing?: No ?Independently performs ADLs?: Yes  (appropriate for developmental age) ?Does the patient have difficulty walking or climbing stairs?: No ?Weakness of Legs: None ?Weakness of Arms/Hands: None ? ?Permission Sought/Granted ?  ?  ?   ?   ?   ?   ? ?Emotional Assessment ?  ?  ?  ?Orientation: : Oriented to Self, Oriented to Place, Oriented to  Time, Oriented to Situation ?Alcohol / Substance Use: Not Applicable ?Psych Involvement: No (comment) ? ?Admission diagnosis:  Scrotal abscess [N49.2] ?Acute respiratory failure with hypoxia ([place]) [J96.01] ?Sepsis, due to unspecified organism, unspecified whether acute organ dysfunction present ([employer]) [A41.9] ?Patient Active Problem List  ? Diagnosis Date Noted  ? Severe sepsis ([place]) 12/25/2021  ? Scrotal abscess 12/24/2021  ? Diverticulitis of large intestine without perforation or abscess without bleeding   ? Polyp of sigmoid colon   ? Benign neoplasm of transverse colon   ? History of diverticulitis 12/08/2017  ? Hypercholesterolemia 10/17/2017  ? Hypertension 10/17/2017  ? Hypogonadism in male 10/17/2017  ? Acute diverticulitis 10/15/2017  ? Other specified anxiety disorders 02/18/2017  ? Elevated liver enzymes 10/17/2016  ? Obstructive sleep apnea 08/29/2016  ? Elevated ferritin 08/04/2015  ? ?PCP:  Adin Hector, MD ?Pharmacy:   ?[id number] [place] [facility] [address] ?[address] ?[place]?Phone: [phone]: [phone]? ?[id number]  [place] [address] ?Phone: [phone] Fax: [phone] ? ?[place] [place] [address] [place] ?[place] [address] ?Phone: [phone] Fax: [phone] ? ? ? ? ?Social Determinants of Health (SDOH) Interventions ?  ? ?Readmission Risk Interventions ?   ? View : No data to display.  ?  ?  ?  ? ? ? ?

## 2021-12-26 NOTE — Progress Notes (Signed)
Pt refused cardiac monitoring, Oncall provider informed. ?

## 2021-12-26 NOTE — Progress Notes (Signed)
Urology Inpatient Progress Note ? ?Subjective: ?Rapid response called overnight due to respiratory distress associated with fever, 38.1 ?C.  He was transferred to the ICU.  CTA chest was negative for PE but notable for moderate interstitial pulmonary edema consistent with volume overload.  He has been started on Lasix.  He is afebrile, VSS this morning with 3.65 L of urinary output yesterday. ?WBC count is stable today at 12.0.  MRSA swab came back positive and his antibiotics have been switched to linezolid per ID.  Wound culture is growing Staph aureus.  AFB culture pending. ?Today reports slightly improved scrotal pain.  He thinks that the penile and scrotal edema are slightly worse, however the induration has improved. ? ?Anti-infectives: ?Anti-infectives (From admission, onward)  ? ? Start     Dose/Rate Route Frequency Ordered Stop  ? 12/26/21 1000  linezolid (ZYVOX) tablet 600 mg  Status:  Discontinued       ? 600 mg Oral Every 12 hours 12/25/21 1314 12/25/21 1834  ? 12/26/21 1000  linezolid (ZYVOX) IVPB 600 mg       ? 600 mg ?300 mL/hr over 60 Minutes Intravenous Every 12 hours 12/25/21 1839    ? 12/25/21 2000  linezolid (ZYVOX) tablet 600 mg  Status:  Discontinued       ? 600 mg Oral Every 12 hours 12/25/21 1836 12/25/21 1838  ? 12/25/21 1834  linezolid (ZYVOX) tablet 600 mg  Status:  Discontinued       ? 600 mg Oral Every 12 hours 12/25/21 1834 12/25/21 1836  ? 12/25/21 1100  vancomycin (VANCOREADY) IVPB 1500 mg/300 mL       ? 1,500 mg ?150 mL/hr over 120 Minutes Intravenous Every 24 hours 12/25/21 1014 12/25/21 1347  ? 12/24/21 2200  piperacillin-tazobactam (ZOSYN) IVPB 3.375 g       ? 3.375 g ?12.5 mL/hr over 240 Minutes Intravenous Every 8 hours 12/24/21 1745    ? 12/24/21 1315  piperacillin-tazobactam (ZOSYN) IVPB 3.375 g       ? 3.375 g ?100 mL/hr over 30 Minutes Intravenous  Once 12/24/21 1311 12/24/21 1458  ? 12/24/21 1145  vancomycin (VANCOREADY) IVPB 2000 mg/400 mL       ? 2,000 mg ?200 mL/hr over  120 Minutes Intravenous  Once 12/24/21 1140 12/24/21 1555  ? ?  ? ? ?Current Facility-Administered Medications  ?Medication Dose Route Frequency Provider Last Rate Last Admin  ? acetaminophen (TYLENOL) tablet 650 mg  650 mg Oral Q6H PRN Vashti Hey, MD   650 mg at 12/26/21 0915  ? Or  ? acetaminophen (TYLENOL) suppository 650 mg  650 mg Rectal Q6H PRN Vashti Hey, MD      ? bisacodyl (DULCOLAX) EC tablet 5 mg  5 mg Oral Daily PRN Vashti Hey, MD      ? Chlorhexidine Gluconate Cloth 2 % PADS 6 each  6 each Topical Q0600 Jennye Boroughs, MD   6 each at 12/25/21 2052  ? enoxaparin (LOVENOX) injection 55 mg  0.5 mg/kg Subcutaneous Q24H Bonnell Public Tublu, MD   55 mg at 12/25/21 2132  ? escitalopram (LEXAPRO) tablet 5 mg  5 mg Oral Daily Bonnell Public Tublu, MD   5 mg at 12/26/21 0915  ? fenofibrate tablet 160 mg  160 mg Oral Daily Jennye Boroughs, MD   160 mg at 12/26/21 0915  ? insulin aspart (novoLOG) injection 0-15 Units  0-15 Units Subcutaneous TID WC Vashti Hey, MD   3 Units at 12/26/21  0843  ? linezolid (ZYVOX) IVPB 600 mg  600 mg Intravenous Q12H Ravishankar, Joellyn Quails, MD      ? morphine (PF) 2 MG/ML injection 2 mg  2 mg Intravenous Q4H PRN Bonnell Public Tublu, MD   2 mg at 12/26/21 0428  ? niacin (SLO-NIACIN) CR tablet 500 mg  500 mg Oral QHS Jennye Boroughs, MD      ? nitroGLYCERIN (NITROSTAT) SL tablet 0.4 mg  0.4 mg Sublingual Q5 min PRN Jennye Boroughs, MD   0.4 mg at 12/25/21 1818  ? ondansetron (ZOFRAN) injection 4 mg  4 mg Intravenous Q6H PRN Para Skeans, MD   4 mg at 12/25/21 1927  ? oxyCODONE (Oxy IR/ROXICODONE) immediate release tablet 5 mg  5 mg Oral Q6H PRN Jennye Boroughs, MD   5 mg at 12/25/21 1308  ? pantoprazole (PROTONIX) EC tablet 40 mg  40 mg Oral BID Benita Gutter, RPH   40 mg at 12/26/21 0915  ? piperacillin-tazobactam (ZOSYN) IVPB 3.375 g  3.375 g Intravenous Q8H Dorothe Pea, RPH 12.5 mL/hr at 12/26/21 0846  Infusion Verify at 12/26/21 0846  ? polyethylene glycol (MIRALAX / GLYCOLAX) packet 17 g  17 g Oral Daily PRN Vashti Hey, MD      ? psyllium (HYDROCIL/METAMUCIL) 1 packet  1 packet Oral QHS Jennye Boroughs, MD      ? sucralfate (CARAFATE) 1 GM/10ML suspension 1 g  1 g Oral TID WC & HS Para Skeans, MD   1 g at 12/26/21 0844  ? ?Objective: ?Vital signs in last 24 hours: ?Temp:  [97.8 ?F (36.6 ?C)-100.5 ?F (38.1 ?C)] 98.2 ?F (36.8 ?C) (05/02 0800) ?Pulse Rate:  [57-93] 74 (05/02 0800) ?Resp:  [13-22] 21 (05/02 0800) ?BP: (100-134)/(67-83) 111/80 (05/02 0800) ?SpO2:  [91 %-100 %] 95 % (05/02 0800) ?Weight:  [115.8 kg] 115.8 kg (05/01 2050) ? ?Intake/Output from previous day: ?05/01 0701 - 05/02 0700 ?In: 138.7 [IV Piggyback:138.7] ?Out: 3650 [Urine:3650] ?Intake/Output this shift: ?Total I/O ?In: 58.1 [IV Piggyback:58.1] ?Out: 400 [Urine:400] ? ?Physical Exam ?Vitals and nursing note reviewed.  ?Constitutional:   ?   General: He is not in acute distress. ?   Appearance: He is not ill-appearing, toxic-appearing or diaphoretic.  ?HENT:  ?   Head: Normocephalic and atraumatic.  ?Pulmonary:  ?   Effort: Pulmonary effort is normal. No respiratory distress.  ?Genitourinary: ?   Comments: Scrotal and penile edema slightly increased compared to yesterday.  Stable erythema of the scrotum and mons pubis.  Induration extending from the left inguinal crease to the perineum has improved since yesterday.  No new areas of fluctuance, crepitus, or purulence. ?Skin: ?   General: Skin is warm and dry.  ?Neurological:  ?   Mental Status: He is alert and oriented to person, place, and time.  ?Psychiatric:     ?   Mood and Affect: Mood normal.     ?   Behavior: Behavior normal.  ? ?Lab Results:  ?Recent Labs  ?  12/25/21 ?0416 12/26/21 ?0329  ?WBC 11.7* 12.0*  ?HGB 13.9 14.2  ?HCT 41.1 41.6  ?PLT 146* 155  ? ?BMET ?Recent Labs  ?  12/25/21 ?1901 12/26/21 ?0329  ?NA 135 136  ?K 3.5 3.6  ?CL 100 101  ?CO2 21* 23  ?GLUCOSE 99  92  ?BUN 17 17  ?CREATININE 1.46* 1.53*  ?CALCIUM 8.6* 8.2*  ? ?PT/INR ?Recent Labs  ?  12/24/21 ?1245  ?LABPROT 14.5  ?INR 1.1  ? ?  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  He was transferred to the ICU overnight due to respiratory distress secondary to volume overload and interstitial pulmonary edema.  He is clinically improving following diuresis. ? ?Scrotal exam overall is slightly improved today on empiric antibiotics.  I am not concerned for new abscess formation requiring I&D. ? ?Recommendations: ?-Continue antibiotics per ID ?-Daily dressing changes with nursing ?-Consider repeat imaging if patient develops new fevers or increased scrotal pain ?-Scrotal elevation and cryotherapy as needed ? ?SDebroah Loop PA-C ?12/26/2021  ?

## 2021-12-26 NOTE — Progress Notes (Addendum)
? ? ? ?Progress Note  ? ? ?Cory Johnston  ZOX:[id number] DOB: 06-29-1971  DOA: 12/24/2021 ?PCP: Adin Hector, MD  ? ? ? ? ?Brief Narrative:  ? ? ?Medical records reviewed and are as summarized below: ? ?Cory Johnston is a 51 y.o. male with medical history significant for prediabetes with hemoglobin A1c of 6.4 and recently prescribed Ozempic (yet to start treatment), prehypertension, acute diverticulitis, fatty liver, gout, anxiety, hyperlipidemia, nephrolithiasis, extracorporeal shockwave lithotripsy left UPJ stone, OSA on CPAP, actinic keratosis. He has a history of recurrent skin infections/abscesses ( history of 5 abscesses since last August (2 axillary, 1 umbilical, 1 scrotal, one lower extremity  He says the leg abscess happened first and started about 10 days after he was bitten on the ankle (same side) by a wahoo while deep sea fishing.  He noticed a small bump in the scrotum about 3 days prior to admission.  He initially went to the urgent care center for painful swelling of the scrotum where he had an I&D.  Subsequently, he presented to the hospital because of increasing pain and swelling in the scrotum.  He developed fevers and chills the day prior to admission.  He was febrile in the emergency room with temperature of 102.4 ?F. ? ?He was found to have severe sepsis secondary to scrotal abscess.  He underwent I&D of scrotal abscess in the emergency room.  He was treated with empiric IV antibiotics and IV fluids. ? ? ? ? ? ? ?Assessment/Plan:  ? ?Principal Problem: ?  Severe sepsis ([place]) ?Active Problems: ?  Scrotal abscess ? ? ?Body mass index is 33.68 kg/m?.  (Obesity) ? ? ?Severe sepsis secondary to right scrotal abscess: S/p I&D of right scrotal abscess in the ED on 12/24/2021.  Continue IV Zosyn and linezolid.  MRSA PCR screen was positive.  Scrotal wound Gram stain showed rare gram positive cocci in pairs, few Staph aureus, scrotal wound AFB was negative, scrotal wound culture showed few Staph  aureus.  Follow-up with urologist and ID specialist.   ? ?Acute hypoxic respiratory failure from fluid overload: He has been weaned down from 7 L/min to 2 L/min oxygen.  Give additional dose of IV Lasix.  Taper off oxygen as able.  2D echo showed estimated EF at 55 to 60%, mild LVH, normal LV diastolic parameters, mild to moderate mitral regurgitation. ? ?AKI: Suspect underlying CKD stage II or stage IIIa.  Monitor BMP closely. ? ?Mild elevated troponin: This was likely from demand ischemia. ? ?Hyperlipidemia/hypertriglyceridemia: Continue fenofibrate and niacin ? ?Use NovoLog as needed for hyperglycemia ? ?Other comorbidities include prediabetes, hypertension, anxiety, fatty liver ? ?Transfer from stepdown unit to MedSurg unit. ? ?Diet Order   ? ?       ?  Diet Carb Modified Fluid consistency: Thin; Room service appropriate? Yes  Diet effective now       ?  ? ?  ?  ? ?  ? ? ? ? ? ? ? ?Consultants: ?Urologist ?Infectious disease ? ?Procedures: ?I&D of right scrotal abscess ? ? ? ?Medications:  ? ? Chlorhexidine Gluconate Cloth  6 each Topical Q0600  ? enoxaparin (LOVENOX) injection  0.5 mg/kg Subcutaneous Q24H  ? escitalopram  5 mg Oral Daily  ? fenofibrate  160 mg Oral Daily  ? insulin aspart  0-15 Units Subcutaneous TID WC  ? niacin  500 mg Oral QHS  ? pantoprazole  40 mg Oral BID  ? psyllium  1 packet Oral QHS  ?  sucralfate  1 g Oral TID WC & HS  ? ?Continuous Infusions: ? linezolid (ZYVOX) IV 300 mL/hr at 12/26/21 1111  ? piperacillin-tazobactam (ZOSYN)  IV 3.375 g (12/26/21 1410)  ? ? ? ?Anti-infectives (From admission, onward)  ? ? Start     Dose/Rate Route Frequency Ordered Stop  ? 12/26/21 1000  linezolid (ZYVOX) tablet 600 mg  Status:  Discontinued       ? 600 mg Oral Every 12 hours 12/25/21 1314 12/25/21 1834  ? 12/26/21 1000  linezolid (ZYVOX) IVPB 600 mg       ? 600 mg ?300 mL/hr over 60 Minutes Intravenous Every 12 hours 12/25/21 1839    ? 12/25/21 2000  linezolid (ZYVOX) tablet 600 mg  Status:   Discontinued       ? 600 mg Oral Every 12 hours 12/25/21 1836 12/25/21 1838  ? 12/25/21 1834  linezolid (ZYVOX) tablet 600 mg  Status:  Discontinued       ? 600 mg Oral Every 12 hours 12/25/21 1834 12/25/21 1836  ? 12/25/21 1100  vancomycin (VANCOREADY) IVPB 1500 mg/300 mL       ? 1,500 mg ?150 mL/hr over 120 Minutes Intravenous Every 24 hours 12/25/21 1014 12/25/21 1347  ? 12/24/21 2200  piperacillin-tazobactam (ZOSYN) IVPB 3.375 g       ? 3.375 g ?12.5 mL/hr over 240 Minutes Intravenous Every 8 hours 12/24/21 1745    ? 12/24/21 1315  piperacillin-tazobactam (ZOSYN) IVPB 3.375 g       ? 3.375 g ?100 mL/hr over 30 Minutes Intravenous  Once 12/24/21 1311 12/24/21 1458  ? 12/24/21 1145  vancomycin (VANCOREADY) IVPB 2000 mg/400 mL       ? 2,000 mg ?200 mL/hr over 120 Minutes Intravenous  Once 12/24/21 1140 12/24/21 1555  ? ?  ? ? ? ? ? ? ? ? ? ?Family Communication/Anticipated D/C date and plan/Code Status  ? ?DVT prophylaxis:  ? ? ?  Code Status: Full Code ? ?Family Communication: Plan discussed with his wife at the bedside ?Disposition Plan: Plan to discharge home in 1 to 2 days ? ? ?Status is: Inpatient ?Remains inpatient appropriate because: IV antibiotics ? ? ? ? ? ? ?Subjective:  ? ?Interval events noted.  Yesterday evening, patient developed acute hypoxic respiratory failure and required up to 7 L/min oxygen via nasal cannula.  He had to be transferred to the stepdown unit for close monitoring. ? ?Objective:  ? ? ?Vitals:  ? 12/26/21 0800 12/26/21 1000 12/26/21 1100 12/26/21 1200  ?BP: 111/80 115/78  112/70  ?Pulse: 74 72 75 64  ?Resp: (!) 21 17 (!) 21 (!) 21  ?Temp: 98.2 ?F (36.8 ?C)   98 ?F (36.7 ?C)  ?TempSrc: Oral   Tympanic  ?SpO2: 95% 91% 97% 92%  ?Weight:      ?Height:      ? ?No data found. ? ? ?Intake/Output Summary (Last 24 hours) at 12/26/2021 1619 ?Last data filed at 12/26/2021 1500 ?Gross per 24 hour  ?Intake 1915.14 ml  ?Output 3595 ml  ?Net -1679.86 ml  ? ?Filed Weights  ? 12/24/21 1113 12/25/21 2050   ?Weight: 108.9 kg 115.8 kg  ? ? ?Exam: ? ?GEN: NAD ?SKIN: No rash ?EYES: EOMI ?ENT: MMM ?CV: RRR ?PULM: Mild bibasilar rales.  No wheezing heard ?ABD: soft, obese, NT, +BS ?CNS: AAO x 3, non focal ?EXT: No edema or tenderness ?GU: Mild scrotal erythema on the left side with minimal tenderness.  Incisional wound on the  scrotum has been packed with gauze ? ? ?  ? ? ?Data Reviewed:  ? ?I have personally reviewed following labs and imaging studies: ? ?Labs: ?Labs show the following:  ? ?Basic Metabolic Panel: ?Recent Labs  ?Lab 12/24/21 ?1245 12/24/21 ?2030 12/25/21 ?0416 12/25/21 ?1901 12/26/21 ?0329  ?NA 136  --  138 135 136  ?K 4.2  --  4.1 3.5 3.6  ?CL 106  --  108 100 101  ?CO2 22  --  24 21* 23  ?GLUCOSE 123*  --  128* 99 92  ?BUN 20  --  '18 17 17  '$ ?CREATININE 1.47* 1.57* 1.51* 1.46* 1.53*  ?CALCIUM 8.9  --  8.6* 8.6* 8.2*  ?MG  --   --   --   --  2.0  ? ?GFR ?Estimated Creatinine Clearance: 75.3 mL/min (A) (by C-G formula based on SCr of 1.53 mg/dL (H)). ?Liver Function Tests: ?Recent Labs  ?Lab 12/24/21 ?1245 12/25/21 ?0416  ?AST 23 61*  ?ALT 29 72*  ?ALKPHOS 36* 38  ?BILITOT 1.3* 0.9  ?PROT 6.6 6.0*  ?ALBUMIN 3.7 3.2*  ? ?No results for input(s): LIPASE, AMYLASE in the last 168 hours. ?No results for input(s): AMMONIA in the last 168 hours. ?Coagulation profile ?Recent Labs  ?Lab 12/24/21 ?1245  ?INR 1.1  ? ? ?CBC: ?Recent Labs  ?Lab 12/24/21 ?1245 12/24/21 ?2030 12/25/21 ?3016 12/26/21 ?0329  ?WBC 12.8* 11.6* 11.7* 12.0*  ?NEUTROABS 10.8*  --   --   --   ?HGB 15.4 13.9 13.9 14.2  ?HCT 45.4 41.3 41.1 41.6  ?MCV 85.7 87.9 88.2 86.8  ?PLT 176 148* 146* 155  ? ?Cardiac Enzymes: ?No results for input(s): CKTOTAL, CKMB, CKMBINDEX, TROPONINI in the last 168 hours. ?BNP (last 3 results) ?No results for input(s): PROBNP in the last 8760 hours. ?CBG: ?Recent Labs  ?Lab 12/25/21 ?1539 12/25/21 ?2055 12/26/21 ?0751 12/26/21 ?1129 12/26/21 ?1558  ?GLUCAP 123* 116* 158* 153* 123*  ? ?D-Dimer: ?No results for input(s):  DDIMER in the last 72 hours. ?Hgb A1c: ?Recent Labs  ?  12/24/21 ?2030  ?HGBA1C 5.9*  ? ?Lipid Profile: ?No results for input(s): CHOL, HDL, LDLCALC, TRIG, CHOLHDL, LDLDIRECT in the last 72 hours. ?Thyroid function s

## 2021-12-27 ENCOUNTER — Other Ambulatory Visit (HOSPITAL_COMMUNITY): Payer: Self-pay

## 2021-12-27 DIAGNOSIS — A4902 Methicillin resistant Staphylococcus aureus infection, unspecified site: Secondary | ICD-10-CM | POA: Diagnosis not present

## 2021-12-27 DIAGNOSIS — E669 Obesity, unspecified: Secondary | ICD-10-CM

## 2021-12-27 DIAGNOSIS — N1831 Chronic kidney disease, stage 3a: Secondary | ICD-10-CM

## 2021-12-27 DIAGNOSIS — N492 Inflammatory disorders of scrotum: Secondary | ICD-10-CM | POA: Diagnosis not present

## 2021-12-27 LAB — CBC
HCT: 41.2 % (ref 39.0–52.0)
Hemoglobin: 14 g/dL (ref 13.0–17.0)
MCH: 29.4 pg (ref 26.0–34.0)
MCHC: 34 g/dL (ref 30.0–36.0)
MCV: 86.6 fL (ref 80.0–100.0)
Platelets: 182 10*3/uL (ref 150–400)
RBC: 4.76 MIL/uL (ref 4.22–5.81)
RDW: 13.2 % (ref 11.5–15.5)
WBC: 7.7 10*3/uL (ref 4.0–10.5)
nRBC: 0 % (ref 0.0–0.2)

## 2021-12-27 LAB — BASIC METABOLIC PANEL
Anion gap: 8 (ref 5–15)
BUN: 16 mg/dL (ref 6–20)
CO2: 25 mmol/L (ref 22–32)
Calcium: 9 mg/dL (ref 8.9–10.3)
Chloride: 106 mmol/L (ref 98–111)
Creatinine, Ser: 1.43 mg/dL — ABNORMAL HIGH (ref 0.61–1.24)
GFR, Estimated: 59 mL/min — ABNORMAL LOW (ref >60)
Glucose, Bld: 95 mg/dL (ref 70–99)
Potassium: 3.9 mmol/L (ref 3.5–5.1)
Sodium: 139 mmol/L (ref 135–145)

## 2021-12-27 LAB — GLUCOSE, CAPILLARY
Glucose-Capillary: 99 mg/dL (ref 70–99)
Glucose-Capillary: 110 mg/dL — ABNORMAL HIGH (ref 70–99)

## 2021-12-27 MED ORDER — LINEZOLID 600 MG PO TABS: 600.0000 mg | ORAL_TABLET | Freq: Two times a day (BID) | ORAL | 0 refills | Status: AC

## 2021-12-27 MED ORDER — OXYCODONE HCL 5 MG PO TABS: 5.0000 mg | ORAL_TABLET | Freq: Four times a day (QID) | ORAL | 0 refills | Status: AC | PRN

## 2021-12-27 MED ORDER — MUPIROCIN 2 % EX OINT: 1.0000 "application " | TOPICAL_OINTMENT | Freq: Two times a day (BID) | CUTANEOUS | 0 refills | Status: AC

## 2021-12-27 NOTE — Discharge Summary (Signed)
?Physician Discharge Summary ?  ?Patient: Cory Johnston MRN: [id number] DOB: Feb 12, 1971  ?Admit date:     12/24/2021  ?Discharge date: 12/29/21  ?Discharge Physician: Annita Brod  ? ?PCP: Adin Hector, MD  ? ?Recommendations at discharge:  ? ?Medication change: Discontinue Augmentin and doxycycline ?New medication: Zyvox 600 mg po bid x 5 days ?New medication: Oxy IR 5 mg po q6 hrs prn pain ?Medication change: Advised to stop all advil, motrin, and aspirin products for renal insufficiency ? ?Discharge Diagnoses: ?Active Problems: ?  Scrotal abscess ?  Stage 3a chronic kidney disease (CKD) ([place]) ?  Hypertension ?  Obstructive sleep apnea ?  Prediabetes ?  Obesity (BMI 30-39.9) ? ?Principal Problem (Resolved): ?  Severe sepsis ([place]) ?Resolved Problems: ?  Acute respiratory failure with hypoxia ([place]) ? ?Hospital Course: ?51 year old with past medical history of obesity, hyperlipidemia and obstructive sleep apnea who has had a history of recurrent skin infections (5 abscesses since last August) who presented to the emergency room 4/30 with pain and swelling in his scrotum after noticing a small bump 3 days prior.  Patient found to have severe sepsis and underwent I&D of scrotal abscess in the emergency room.  After that he was admitted to the hospitalist service and started on broad-spectrum antibiotics.  Urology and infectious disease consulted. ? ?Per sepsis protocol, patient was aggressively volume resuscitated.  He subsequently developed acute respiratory failure with hypoxia and transferred to the ICU.  Fluids were stopped and he was started on Lasix.  He had been requiring 7 L nasal cannula and this was able to be weaned down to 2 L.  CT of chest ruled out PE.  Echocardiogram unremarkable. ? ?Assessment and Plan: ?* Severe sepsis (HCC)-resolved as of 12/29/2021 ?Patient meets criteria for severe sepsis on admission given fever, leukocytosis and tachypnea with hypotension requiring fluid resuscitation and  scrotal abscess source.  Patient treated aggressively with IV fluids and antibiotics and sepsis resolved. ? ?Scrotal abscess ?Patient has had a number of skin abscesses in the past year.  Seen by urology.  Started on broad-spectrum antibiotics.  Underwent I&D in the emergency room.  Wound care at scrotal wound site.  Cultures came back for MRSA.  Patient also colonized with MRSA.  Upon discharge, patient will discharge on Zyvox as well as 5 days of mupirocin. ? ?Stage 3a chronic kidney disease (CKD) ([place]) ?Discussed with pt and wife.  Reviewed older labs.  Unclear why patient has renal insufficiency but he does frequently use nsaids for migraines, so advised avoidance of all NSAID products in the future and Tylenol only. ? ?Acute respiratory failure with hypoxia (HCC)-resolved as of 12/29/2021 ?following aggressive fluid resuscitation from sepsis, patient became short of breath and found to be hypoxic requiring up to 7 L nasal cannula.  Found to be volume overloaded and transferred to ICU.  Echocardiogram checked and no evidence of any heart failure or diastolic/systolic dysfunction.  He was aggressively diuresed and able to be weaned down to room air soon after. ? ?Hypertension ?Stable ? ?Prediabetes ?A1c of 5.8,  ? ?Obesity (BMI 30-39.9) ?Meets criteria with BMI > 30 ? ? ? ? ?  ? ?Pain control - [employer] Controlled Substance Reporting System database was reviewed. and patient was instructed, not to drive, operate heavy machinery, perform activities at heights, swimming or participation in water activities or provide baby-sitting services while on Pain, Sleep and Anxiety Medications; until their outpatient Physician has advised to do so again. Also  recommended to not to take more than prescribed Pain, Sleep and Anxiety Medications.  ?Consultants: Infectious Disease, Urology ?Procedures performed: None  ?Disposition: Home ?Diet recommendation:  ?Discharge Diet Orders (From admission, onward)  ? ?  Start      Ordered  ? 12/27/21 0000  Diet - low sodium heart healthy       ? 12/27/21 1440  ? ?  ?  ? ?  ? ?Cardiac diet ?DISCHARGE MEDICATION: ?Allergies as of 12/27/2021   ?No Known Allergies ?  ? ?  ?Medication List  ?  ? ?STOP taking these medications   ? ?amoxicillin-clavulanate 875-125 MG tablet ?Commonly known as: AUGMENTIN ?  ?doxycycline 100 MG capsule ?Commonly known as: VIBRAMYCIN ?  ?HYDROcodone-acetaminophen 5-325 MG tablet ?Commonly known as: NORCO/VICODIN ?  ?ibuprofen 200 MG tablet ?Commonly known as: ADVIL ?  ? ?  ? ?TAKE these medications   ? ?amLODipine 10 MG tablet ?Commonly known as: NORVASC ?Take 10 mg by mouth daily. ?  ?escitalopram 5 MG tablet ?Commonly known as: LEXAPRO ?Take 5 mg by mouth daily. ?  ?fenofibrate 160 MG tablet ?Take 160 mg by mouth daily. ?  ?linezolid 600 MG tablet ?Commonly known as: Zyvox ?Take 1 tablet (600 mg total) by mouth 2 (two) times daily for 14 days. ?  ?Multivitamin Men Tabs ?Take 1 tablet by mouth daily. ?  ?mupirocin ointment 2 % ?Commonly known as: BACTROBAN ?Apply 1 application. topically 2 (two) times daily for 5 days. Apply to each nostril ?  ?niacin 500 MG tablet ?Commonly known as: SLO-NIACIN ?Take 500 mg by mouth at bedtime. ?  ?oxyCODONE 5 MG immediate release tablet ?Commonly known as: Oxy IR/ROXICODONE ?Take 1 tablet (5 mg total) by mouth every 6 (six) hours as needed for moderate pain. ?  ?psyllium 58.6 % powder ?Commonly known as: METAMUCIL ?Take 1 packet by mouth 2 (two) times daily. ?  ?testosterone cypionate 200 MG/ML injection ?Commonly known as: DEPOTESTOSTERONE CYPIONATE ?Inject 1 mL into the muscle every 14 (fourteen) days. ?  ? ?  ? ?ASK your doctor about these medications   ? ?ondansetron 4 MG disintegrating tablet ?Commonly known as: ZOFRAN-ODT ?Take 1 tablet (4 mg total) by mouth every 8 (eight) hours as needed for up to 3 days for nausea or vomiting. ?Ask about: Should I take this medication? ?  ? ?  ? ?  ?  ? ? ?  ?Discharge Care Instructions   ?(From admission, onward)  ?  ? ? ?  ? ?  Start     Ordered  ? 12/27/21 0000  Discharge wound care:       ?Comments: Exchange scrotal packing daily with 1/4 inch iodoform gauze  ? 12/27/21 1440  ? ?  ?  ? ?  ? ? ?Discharge Exam: ?Filed Weights  ? 12/24/21 1113 12/25/21 2050  ?Weight: 108.9 kg 115.8 kg  ? ?General: alert & oriented x3, no acute distress ?Cardiovascular: regular rate & rhythm, S1S2 ?Lungs: clear to auscultation bilaterally ? ?Condition at discharge: good ? ?The results of significant diagnostics from this hospitalization (including imaging, microbiology, ancillary and laboratory) are listed below for reference.  ? ?Imaging Studies: ?CT Angio Chest Pulmonary Embolism (PE) W or WO Contrast ? ?Result Date: 12/25/2021 ?CLINICAL DATA:  Worsening shortness of breath. EXAM: CT ANGIOGRAPHY CHEST WITH CONTRAST TECHNIQUE: Multidetector CT imaging of the chest was performed using the standard protocol during bolus administration of intravenous contrast. Multiplanar CT image reconstructions and MIPs were obtained to evaluate the  vascular anatomy. RADIATION DOSE REDUCTION: This exam was performed according to the departmental dose-optimization program which includes automated exposure control, adjustment of the mA and/or kV according to patient size and/or use of iterative reconstruction technique. CONTRAST:  41m OMNIPAQUE IOHEXOL 350 MG/ML SOLN COMPARISON:  None. FINDINGS: Cardiovascular: Satisfactory opacification of the pulmonary arteries to the segmental level. No evidence of pulmonary embolism. Normal heart size. No pericardial effusion. Mediastinum/Nodes: There is mild precarinal lymphadenopathy. Thyroid gland, trachea, and esophagus demonstrate no significant findings. Lungs/Pleura: Moderate severity diffuse interstitial thickening is seen. Mild, predominant peripheral atelectatic changes are seen involving the bilateral upper lobes and right middle lobe, with marked severity bilateral lower lobe  atelectasis and/or infiltrate. There are small bilateral pleural effusions. Upper Abdomen: There is a small hiatal hernia. Diffuse fatty infiltration of the liver parenchyma is seen. Musculoskeletal: No chest wall abnorma

## 2021-12-27 NOTE — TOC Benefit Eligibility Note (Signed)
Patient Advocate Encounter ? ?Insurance verification completed.   ? ?The patient is currently admitted and upon discharge could be taking linezolid (Zyvox) 600 mg tablets. ? ?The current 30 day co-pay is, $10.00.  ? ?The patient is insured through [employer]  ? ? ? ?Lyndel Safe, CPhT ?Pharmacy Patient Advocate Specialist ?[facility] Patient Advocate Team ?Direct Number: [phone]  Fax: [phone] ? ? ? ? ? ?  ?

## 2021-12-27 NOTE — Progress Notes (Signed)
? ?  Date of Admission:  12/24/2021   T ?ID: Cory Johnston is a 51 y.o. male Principal Problem: ?  Severe sepsis ([place]) ?Active Problems: ?  Scrotal abscess ? ?Events of last evening noted when patient was short of breath and having chest pressure and also low-grade fever. ?He had CTA of the chest which ruled out PE ?He was transferred to the ICU and received furosemide, morphine and nitroglycerin and he improved ? ?Doing much better ?Scrotal swelling and pain much better ? ?Medications:  ? Chlorhexidine Gluconate Cloth  6 each Topical Q0600  ? enoxaparin (LOVENOX) injection  0.5 mg/kg Subcutaneous Q24H  ? escitalopram  5 mg Oral Daily  ? fenofibrate  160 mg Oral Daily  ? insulin aspart  0-15 Units Subcutaneous TID WC  ? niacin  500 mg Oral QHS  ? pantoprazole  40 mg Oral BID  ? psyllium  1 packet Oral QHS  ? sucralfate  1 g Oral TID WC & HS  ? ? ?Objective: ?Vital signs in last 24 hours: ?Temp:  [97.7 ?F (36.5 ?C)-98.8 ?F (37.1 ?C)] 97.7 ?F (36.5 ?C) (05/03 6160) ?Pulse Rate:  [57-74] 60 (05/03 0743) ?Resp:  [17-21] 18 (05/03 0743) ?BP: (108-132)/(70-85) 111/71 (05/03 0743) ?SpO2:  [92 %-96 %] 95 % (05/03 0743) ? ?PHYSICAL EXAM:  ?General: Alert, cooperative, -sitting in chair and eating icecream ?Lungs: Bilateral air entry. ? ?Heart: Regular rate and rhythm, no murmur, rub or gallop. ?Abdomen: Soft, non-tender,not distended. Bowel sounds normal. No masses ?Scrotum less erythematous ?Less swollen ?Left inguinal induration much better ? ? ? ?Extremities: atraumatic, no cyanosis. No edema. No clubbing ?Skin: No rashes or lesions. Or bruising ?Lymph: Cervical, supraclavicular normal. ?Neurologic: Grossly non-focal ? ?Lab Results ?Recent Labs  ?  12/26/21 ?0329 12/27/21 ?0446  ?WBC 12.0* 7.7  ?HGB 14.2 14.0  ?HCT 41.6 41.2  ?NA 136 139  ?K 3.6 3.9  ?CL 101 106  ?CO2 23 25  ?BUN 17 16  ?CREATININE 1.53* 1.43*  ? ?Liver Panel ?Recent Labs  ?  12/24/21 ?1245 12/25/21 ?0416  ?PROT 6.6 6.0*  ?ALBUMIN 3.7 3.2*  ?AST 23 61*   ?ALT 29 72*  ?ALKPHOS 36* 38  ?BILITOT 1.3* 0.9  ? ?Microbiology: ?12/25/2021 ?Scrotal wound culture ?Staphylococcus aureus.  MRSA ?12/24/2021 blood culture no growth ?Studies/Results: ?I have personally reviewed the films ?Development of fairly extensive bilateral interstitial and streaky pulmonary opacities suggestive of edema ? ?Assessment/Plan: ? ?Scrotal cellulitis and soft tissue infection secondary to MRSA ?On linezolid ?Will need for 2 weeks ? ?Acute hypoxia secondary to pulmonary edema- much better after  diuretics ? ?Past h/o left leg infection after a bite from [employer]-  ?Doubt this is an atypical mycobacteria ( m.marinum) infection  ?  ?? ?Anxiety on escitalopram '5mg'$  ?  ?Hyperglycemia diagnosed this admission  - on insulin ?  ?AKI -  ? ?Discussed the management with the patient, wife - gave extensive material on decolonization and environmental hygiene- He will start decolonization protocol after he completes antibiotic ?Follow up in my office in 10 days ?  ?

## 2021-12-27 NOTE — Hospital Course (Signed)
51 year old with past medical history of obesity, hyperlipidemia and obstructive sleep apnea who has had a history of recurrent skin infections (5 abscesses since last August) who presented to the emergency room 4/30 with pain and swelling in his scrotum after noticing a small bump 3 days prior.  Patient found to have severe sepsis and underwent I&D of scrotal abscess in the emergency room.  After that he was admitted to the hospitalist service and started on broad-spectrum antibiotics.  Urology and infectious disease consulted. ? ?Per sepsis protocol, patient was aggressively volume resuscitated.  He subsequently developed acute respiratory failure with hypoxia and transferred to the ICU.  Fluids were stopped and he was started on Lasix.  He had been requiring 7 L nasal cannula and this was able to be weaned down to 2 L.  CT of chest ruled out PE.  Echocardiogram unremarkable. ?

## 2021-12-29 DIAGNOSIS — E669 Obesity, unspecified: Secondary | ICD-10-CM | POA: Diagnosis present

## 2021-12-29 DIAGNOSIS — R7303 Prediabetes: Secondary | ICD-10-CM | POA: Diagnosis present

## 2021-12-29 DIAGNOSIS — J9601 Acute respiratory failure with hypoxia: Secondary | ICD-10-CM | POA: Diagnosis not present

## 2021-12-29 DIAGNOSIS — N1831 Chronic kidney disease, stage 3a: Secondary | ICD-10-CM | POA: Diagnosis present

## 2021-12-29 LAB — CULTURE, BLOOD (ROUTINE X 2)
Culture: NO GROWTH
Culture: NO GROWTH
Special Requests: ADEQUATE

## 2021-12-29 NOTE — Assessment & Plan Note (Signed)
Discussed with pt and wife.  Reviewed older labs.  Unclear why patient has renal insufficiency but he does frequently use nsaids for migraines, so advised avoidance of all NSAID products in the future and Tylenol only. ?

## 2021-12-29 NOTE — Assessment & Plan Note (Signed)
A1c of 5.8,  ?

## 2021-12-29 NOTE — Assessment & Plan Note (Signed)
Meets criteria with BMI > 30 ?

## 2021-12-29 NOTE — Assessment & Plan Note (Signed)
Patient has had a number of skin abscesses in the past year.  Seen by urology.  Started on broad-spectrum antibiotics.  Underwent I&D in the emergency room.  Wound care at scrotal wound site.  Cultures came back for MRSA.  Patient also colonized with MRSA.  Upon discharge, patient will discharge on Zyvox as well as 5 days of mupirocin. ?

## 2021-12-29 NOTE — Assessment & Plan Note (Signed)
Stable

## 2021-12-29 NOTE — Assessment & Plan Note (Signed)
Patient meets criteria for severe sepsis on admission given fever, leukocytosis and tachypnea with hypotension requiring fluid resuscitation and scrotal abscess source.  Patient treated aggressively with IV fluids and antibiotics and sepsis resolved. ?

## 2021-12-29 NOTE — Assessment & Plan Note (Signed)
following aggressive fluid resuscitation from sepsis, patient became short of breath and found to be hypoxic requiring up to 7 L nasal cannula.  Found to be volume overloaded and transferred to ICU.  Echocardiogram checked and no evidence of any heart failure or diastolic/systolic dysfunction.  He was aggressively diuresed and able to be weaned down to room air soon after. ?

## 2021-12-30 LAB — AEROBIC/ANAEROBIC CULTURE W GRAM STAIN (SURGICAL/DEEP WOUND): Gram Stain: NONE SEEN

## 2022-01-04 ENCOUNTER — Ambulatory Visit: Payer: BC Managed Care – PPO | Attending: Infectious Diseases | Admitting: Infectious Diseases

## 2022-01-04 ENCOUNTER — Encounter: Payer: Self-pay | Admitting: Infectious Diseases

## 2022-01-04 VITALS — BP 119/81 | HR 61 | Temp 97.9°F | Ht 73.0 in | Wt 236.0 lb

## 2022-01-04 DIAGNOSIS — I10 Essential (primary) hypertension: Secondary | ICD-10-CM | POA: Insufficient documentation

## 2022-01-04 DIAGNOSIS — A4902 Methicillin resistant Staphylococcus aureus infection, unspecified site: Secondary | ICD-10-CM | POA: Insufficient documentation

## 2022-01-04 DIAGNOSIS — G4730 Sleep apnea, unspecified: Secondary | ICD-10-CM | POA: Diagnosis not present

## 2022-01-04 DIAGNOSIS — N492 Inflammatory disorders of scrotum: Secondary | ICD-10-CM | POA: Diagnosis not present

## 2022-01-04 DIAGNOSIS — Z8249 Family history of ischemic heart disease and other diseases of the circulatory system: Secondary | ICD-10-CM | POA: Insufficient documentation

## 2022-01-04 DIAGNOSIS — Z8614 Personal history of Methicillin resistant Staphylococcus aureus infection: Secondary | ICD-10-CM | POA: Diagnosis present

## 2022-01-04 NOTE — Progress Notes (Signed)
NAME: Cory Johnston  ?DOB: 02-25-1971  ?MRN: [id number]  ?Date/Time: 01/04/2022 8:50 AM ? ? ?? ?Cory Johnston is a 51 y.o. with a history  ?Hypertension, sleep apnea minimal ?Recent hospitalization at [facility] between 12/24/2021 until 12/29/2021 for scrotal abscess and cellulitis and had a small I&D and the culture was positive for MRSA.  After getting intravenous antibiotics in the hospital he was discharged on linezolid 600 mg p.o. twice daily for total of 14 days. ?He is here for his follow-up ?He is doing much better ?Is gone back to work ?This minimal swelling or pain in the scrotum ?No fever ?He had started the decolonization protocol.  Because he has had multiple boils in the past year.  He also has MRSA in his nares. ?He is on 2.5 mg of escitalopram.  No symptoms of serotonin syndrome with linezolid. ?Past Medical History:  ?Diagnosis Date  ? Acute diverticulitis 10/15/2017  ? Anxiety   ? Complication of anesthesia   ? Elevated ferritin 08/04/2015  ? Fatty liver   ? Gout   ? hx of  ? Hallux valgus of left foot   ? History of actinic keratosis 05/14/2016  ? left post auricular neck, bx proven  ? Hyperlipemia, mixed   ? Hypertension   ? controlled on meds  ? Nephrolithiasis   ? PONV (postoperative nausea and vomiting)   ? Sleep apnea   ? cpap  ?  ?Past Surgical History:  ?Procedure Laterality Date  ? AIKEN OSTEOTOMY Left 02/01/2016  ? Procedure: Barbie Banner OSTEOTOMY AUSTIN LEFT 1ST METATARSAL;  Surgeon: Samara Deist, DPM;  Location: [facility];  Service: Podiatry;  Laterality: Left;  WITH POPLITEAL ?CPAP  ? ANKLE FRACTURE SURGERY Left   ? COLONOSCOPY WITH PROPOFOL N/A 01/23/2018  ? Procedure: COLONOSCOPY WITH PROPOFOL;  Surgeon: Lucilla Lame, MD;  Location: [facility];  Service: Endoscopy;  Laterality: N/A;  ? EXTRACORPOREAL SHOCK WAVE LITHOTRIPSY Left 10/20/2020  ? Procedure: EXTRACORPOREAL SHOCK WAVE LITHOTRIPSY (ESWL);  Surgeon: Billey Co, MD;  Location: [facility];  Service: Urology;  Laterality: Left;   ? POLYPECTOMY N/A 01/23/2018  ? Procedure: POLYPECTOMY;  Surgeon: Lucilla Lame, MD;  Location: [facility];  Service: Endoscopy;  Laterality: N/A;  ? TONSILLECTOMY AND ADENOIDECTOMY    ? TRIGGER FINGER RELEASE    ? VASECTOMY    ?  ?Social History  ? ?Socioeconomic History  ? Marital status: Married  ?  Spouse name: Not on file  ? Number of children: Not on file  ? Years of education: Not on file  ? Highest education level: Not on file  ?Occupational History  ? Not on file  ?Tobacco Use  ? Smoking status: Never  ? Smokeless tobacco: Never  ?Vaping Use  ? Vaping Use: Never used  ?Substance and Sexual Activity  ? Alcohol use: Not Currently  ?  Alcohol/week: 14.0 standard drinks  ?  Types: 12 Cans of beer, 2 Shots of liquor per week  ?  Comment: 3 x week beer and liquor  ? Drug use: No  ? Sexual activity: Not on file  ?Other Topics Concern  ? Not on file  ?Social History Narrative  ? Not on file  ? ?Social Determinants of Health  ? ?Financial Resource Strain: Not on file  ?Food Insecurity: Not on file  ?Transportation Needs: Not on file  ?Physical Activity: Not on file  ?Stress: Not on file  ?Social Connections: Not on file  ?Intimate Partner Violence: Not on file  ?  ?  Family History  ?Problem Relation Age of Onset  ? Hypertension Father   ? Stroke Father   ? CAD Father   ? Hyperlipidemia Father   ? Diabetes Father   ? Stroke Paternal Aunt   ? ?No Known Allergies ?I? ?Current Outpatient Medications  ?Medication Sig Dispense Refill  ? amLODipine (NORVASC) 10 MG tablet Take 10 mg by mouth daily.    ? escitalopram (LEXAPRO) 5 MG tablet Take 5 mg by mouth daily.  4  ? fenofibrate 160 MG tablet Take 160 mg by mouth daily.  4  ? icosapent Ethyl (VASCEPA) 1 g capsule Take 2 capsules by mouth 2 (two) times daily.    ? linezolid (ZYVOX) 600 MG tablet Take 1 tablet (600 mg total) by mouth 2 (two) times daily for 14 days. 28 tablet 0  ? Multiple Vitamins-Minerals (MULTIVITAMIN MEN) TABS Take 1 tablet by mouth daily.    ?  niacin (SLO-NIACIN) 500 MG tablet Take 500 mg by mouth at bedtime.    ? psyllium (METAMUCIL) 58.6 % powder Take 1 packet by mouth 2 (two) times daily.    ? testosterone cypionate (DEPOTESTOSTERONE CYPIONATE) 200 MG/ML injection Inject 1 mL into the muscle every 14 (fourteen) days.  1  ? oxyCODONE (OXY IR/ROXICODONE) 5 MG immediate release tablet Take 1 tablet (5 mg total) by mouth every 6 (six) hours as needed for moderate pain. (Patient not taking: Reported on 01/04/2022) 12 tablet 0  ? ?No current facility-administered medications for this visit.  ?  ? ?Abtx:  ?Anti-infectives (From admission, onward)  ? ? None  ? ?  ? ? ?REVIEW OF SYSTEMS:  ?Const: negative fever, negative chills, negative weight loss ?Eyes: negative diplopia or visual changes, negative eye pain ?ENT: negative coryza, negative sore throat ?Resp: Minimal cough, no hemoptysis, dyspnea ?Cards: negative for chest pain, palpitations, lower extremity edema ?GU: Scrotal pain and minimal swelling ?No dysuria or hematuria ?GI: Negative for abdominal pain, diarrhea, bleeding, constipation ?Skin: negative for rash and pruritus ?Heme: negative for easy bruising and gum/nose bleeding ?MS: negative for myalgias, arthralgias, back pain and muscle weakness ?Neurolo:negative for headaches, dizziness, vertigo, memory problems  ?Psych: Anxiety ?Endocrine: negative for thyroid, diabetes ?Allergy/Immunology- negative for any medication or food allergies ? ?Objective:  ?VITALS:  ?BP 119/81   Pulse 61   Temp 97.9 ?F (36.6 ?C) (Temporal)   Ht '6\' 1"'$  (1.854 m)   Wt 236 lb (107 kg)   BMI 31.14 kg/m?  ? ?PHYSICAL EXAM:  ?General: Alert, cooperative, no distress, appears stated age.  Ruddy skin ?Head: Normocephalic, without obvious abnormality, atraumatic. ?Eyes: Conjunctivae clear, anicteric sclerae. Pupils are equal ?ENT Nares normal. No drainage or sinus tenderness. ?Lips, mucosa, and tongue normal. No Thrush ?Neck: Supple, symmetrical, no adenopathy, thyroid: non  tender ?no carotid bruit and no JVD. ?Back: No CVA tenderness. ?Scrotal swelling almost resolved ?Minimal induration around the I&D site on the right base.  No erythema ?Lungs: Clear to auscultation bilaterally. No Wheezing or Rhonchi. No rales. ?Heart: Regular rate and rhythm, no murmur, rub or gallop. ?Abdomen: Soft, non-tender,not distended. Bowel sounds normal. No masses ?Extremities: atraumatic, no cyanosis. No edema. No clubbing ?Skin: No rashes or lesions. Or bruising ?Lymph: Cervical, supraclavicular normal. ?Neurologic: Grossly non-focal ?Pertinent Labs ?Lab Results ?CBC ?   ?Component Value Date/Time  ? WBC 7.7 12/27/2021 0446  ? RBC 4.76 12/27/2021 0446  ? HGB 14.0 12/27/2021 0446  ? HCT 41.2 12/27/2021 0446  ? PLT 182 12/27/2021 0446  ? MCV 86.6  12/27/2021 0446  ? MCH 29.4 12/27/2021 0446  ? MCHC 34.0 12/27/2021 0446  ? RDW 13.2 12/27/2021 0446  ? LYMPHSABS 0.8 12/24/2021 1245  ? MONOABS 0.9 12/24/2021 1245  ? EOSABS 0.2 12/24/2021 1245  ? BASOSABS 0.0 12/24/2021 1245  ? ? ? ?  Latest Ref Rng & Units 12/27/2021  ?  4:46 AM 12/26/2021  ?  3:29 AM 12/25/2021  ?  7:01 PM  ?CMP  ?Glucose 70 - 99 mg/dL 95   92   99    ?BUN 6 - 20 mg/dL '16   17   17    '$ ?Creatinine 0.61 - 1.24 mg/dL 1.43   1.53   1.46    ?Sodium 135 - 145 mmol/L 139   136   135    ?Potassium 3.5 - 5.1 mmol/L 3.9   3.6   3.5    ?Chloride 98 - 111 mmol/L 106   101   100    ?CO2 22 - 32 mmol/L '25   23   21    '$ ?Calcium 8.9 - 10.3 mg/dL 9.0   8.2   8.6    ? ? ? ? ?Microbiology: ?Recent Results (from the past 240 hour(s))  ?MRSA Next Gen by PCR, Nasal     Status: Abnormal  ? Collection Time: 12/25/21 11:43 AM  ? Specimen: Nasal Mucosa; Nasal Swab  ?Result Value Ref Range Status  ? MRSA by PCR Next Gen DETECTED (A) NOT DETECTED Final  ?  Comment: RESULT CALLED TO, READ BACK BY AND VERIFIED WITH: ?Margit Hanks, RN 12/25/21 1413 MW ?(NOTE) ?The GeneXpert MRSA Assay (FDA approved for NASAL specimens only), ?is one component of a comprehensive MRSA  colonization surveillance ?program. It is not intended to diagnose MRSA infection nor to guide ?or monitor treatment for MRSA infections. ?Test performance is not FDA approved in patients less than 2 years ?old. ?Perfor

## 2022-01-04 NOTE — Patient Instructions (Addendum)
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  ?Prepare ?Chose a period when you will be uninterrupted by going away or other distractions. To ensure that your skin is in good condition, follow the Routine Skin Care principles below to reduce drying and enhance healing. Do not start while you have any active boils. ?Routine Skin Care principles to reduce drying and enhance healing: ?Avoid the use of soap when bathing or showering when performing this decolonization. DO NOT routinely use antiseptic solutions. If you need to use something, chose a soap substitute (examples -QV Wash or Cetaphil).  ?When drying with a towel, be gentle and pat dry your skin. Avoid rubbing the skin.  ?Reduce the overall frequency of bathing or showering. A short shower (3 minutes) is better than a bath in terms of its effect on the skin.  ?Use a simple sorbelene based-cream on your skin prior to showering and immediately after drying. (Examples: Hydroderm or other Sorbelene-based preparations). Especially protect healing or dry areas of skin in this way. Don?t use a barrier cream with a vaseline base or with perfumes and additives. You are more likely to be allergic to these products, and cause further damage to your skin.  ?Make sure that you clean and cover any skin cuts or grazes that occur. Try to avoid picking or biting fingernails and the skin around the nails Keep your fingernails clipped short and clean to reduce problems caused by scratching.  ?For itchy skin, try gently massaging sorbelene-based cream into itchy areas instead of scratching it. A long-acting non-sedating antihistamine drug is the next option to try. However make sure that you are not taking medication that will interact with this drug type.  ?                                                                        To prepare yourself for your treatment, it is recommended that you complete the following steps: ?Remove nose, ear and other body piercing items for several days prior to the treatment and keep them out during the treatment period  ?Purchase a new toothbrush, disposable razor (if used), sterident for dentures (if required) and a container of alcohol hand hygiene solution (gel or rub)  ?66 old toothbrushes and razors when the treatment starts. Also discard opened deodorant rollers, skin adhesive tapes, skin creams and solutions- all of these may already be contaminated with staph  ?Discard pumice stone(s), sponges and disposable face cloths if used   ?Discard all make-up brushes, creams, and implements  ?Discard or hot wash all fluffy toys  ?Wash hair brush and comb, nail files, plastic toys and cutters in the dishwasher or purchase new ones  ?Remove nail varnish and artificial nails  ?Daily routine for 5 days ?    *Minimize contact with members of the community during the 5 days of treatment as much as possible* ?Body washes ?The effectiveness of the program increases if the correct procedure is used: ?Apply the provided antiseptic body wash (2% chlorhexidine) in the shower daily  ?Take care to wash hair, under  the arms and into the groin and into any folds of skin  ?Allow the antiseptic to remain on the skin for at least 5 minutes  ?Nasal ointment ?Disinfect your hands with alcohol gel/rub and allow to dry   ?Open the mupirocin 2% (Bactroban) nasal ointment.  ?Place small amount (size of match head) of ointment onto a clean cotton swab and massage gently around the inside of the nostril on one side, making sure not to insert it too deeply (no more than 2 cm or a little less than an inch).  ?Use a new cotton bud for the other nostril so that you do not contaminate the Bactroban tube with staph.   ?After applying the ointment, press a finger against the nose next to the nostril opening and use a  circular motion to spread the ointment within the nose  ?Apply the mupirocin ointment two times a day for 5 days  ?Disinfect your hands with alcohol rub/gel after applying the ointmentp>  ?Personal items (combs, razor, eyeglasses, jewelry, etc.) ?    Disinfect all personal items daily with an alcohol-based cleanser  ?House Environment and Clothes/Linens ?On day 2 and 5 of the treatment, clean your house, (especially the bedroom and bathroom). Clean dust off all surfaces and then vacuum clean all floor surfaces AND soft furnishings (such as your favorite chair). If your chair/couch has a vinyl or leather covering then wipe over the chair with warm soapy water and then dry with a clean towel (which should then be washed). Staph lives in skin scales from humans that contaminate the environment. This can lead to re-infection.   ?Disinfect the shower floor and/or bath tub daily   ?On days 1, 3, AND 5 of the treatment wash your clothes, underwear, pajamas and bed linen (such as towels, sheets, washcloths, and bath mats). A hot wash with laundry detergent is best (there is no need to use expensive laundry detergent or powder). Dry clothes in sun if possible. Change into clean clothes or pajamas on those days after your shower.   ?Do not share or exchange any personal items of clothing  ?Sports/Gym ?    Surfaces, equipment and towels, and skin-to-skin contact are all potential sources for staph re-infection ?Pets ?Dogs and other companion animals can also be colonized with the same strains of MRSA. Best to wash or replace bedding material for the animal and wash the animal at least once during the treatment period with antiseptic solution (2% chlorhexidine wash). Ensure that the skin of the animal is kept in good condition. If the pet has any chronic skin disorders, consult your vet prior to starting your treatment process. ?What about my partner, family, or household members? ?Usually when an aggressive strain of staph  moves in to a family or household, only certain members of that group get infections (boils). This is despite the fact that the strain has probably transferred itself from person to person within the group. Those without boils may also be carrying the bacterium, however they must have better resistance (immunity) or perhaps have better skin condition. Staph likes to invade through cuts, scratches and skin with dermatitis or dryness.  ? ? ?Follow-up after decolonization treatment  ?Possible approaches will vary depending on how your treatment goes. Your provider will instruct you on the follow-up best suited for you. Some options are:  ?Wait and see - if no further boils occur within 6 months then it is probable that the strain of staph has been eliminated from  you.   ?Continue intermittent body washes 1-2 times per week with 2% aqueous chlorhexidine soap preparation or similar   ?Future antibiotic use  ?The best preventative approach to avoiding future problems may be to avoid use of antibiotics unless there is a strong indication. Antibiotics are often prescribed for minor infections or for respiratory infections that are mostly due to viruses. It is in your best interest to ride these infections out rather than taking antibiotics. Taking antibiotics alters your natural bacterial flora on your skin and in your gut. This may reduce your resistance against acquiring a resistant bacterial strain such as MRSA).  ? ?Important note: if you do become very ill with possible infection and require hospital review, it is important for you to remind your medical care providers that you have been colonized with MRSA in the past as they may have to use antibiotics that are active against the strain that you had previously.  ? ?References ?Wiese-Posselt et al. Clin Inf Dis 9381:[id number]  ?CDC:  StyleTurn.tn.html ? ?Follow with me as needed ?

## 2022-01-08 ENCOUNTER — Ambulatory Visit
Admission: RE | Admit: 2022-01-08 | Discharge: 2022-01-08 | Disposition: A | Discharge status: Home / Self Care | Payer: BC Managed Care – PPO | Source: Ambulatory Visit | Attending: Internal Medicine | Admitting: Internal Medicine

## 2022-01-08 DIAGNOSIS — M542 Cervicalgia: Secondary | ICD-10-CM

## 2022-01-09 ENCOUNTER — Inpatient Hospital Stay: Payer: BC Managed Care – PPO | Admitting: Infectious Diseases

## 2022-01-13 IMAGING — CR DG ABDOMEN 1V
1 series · 2 of 2 positions shown · non-contrast
Comparison: CT 12/23/2017.

CLINICAL DATA: Lithotripsy.  Left-sided renal stone.

EXAM:
ABDOMEN - 1 VIEW

[Series 1: dg abd 1 view · 0.14mm/px · 2 of 2 slices shown]
[im 1/2]
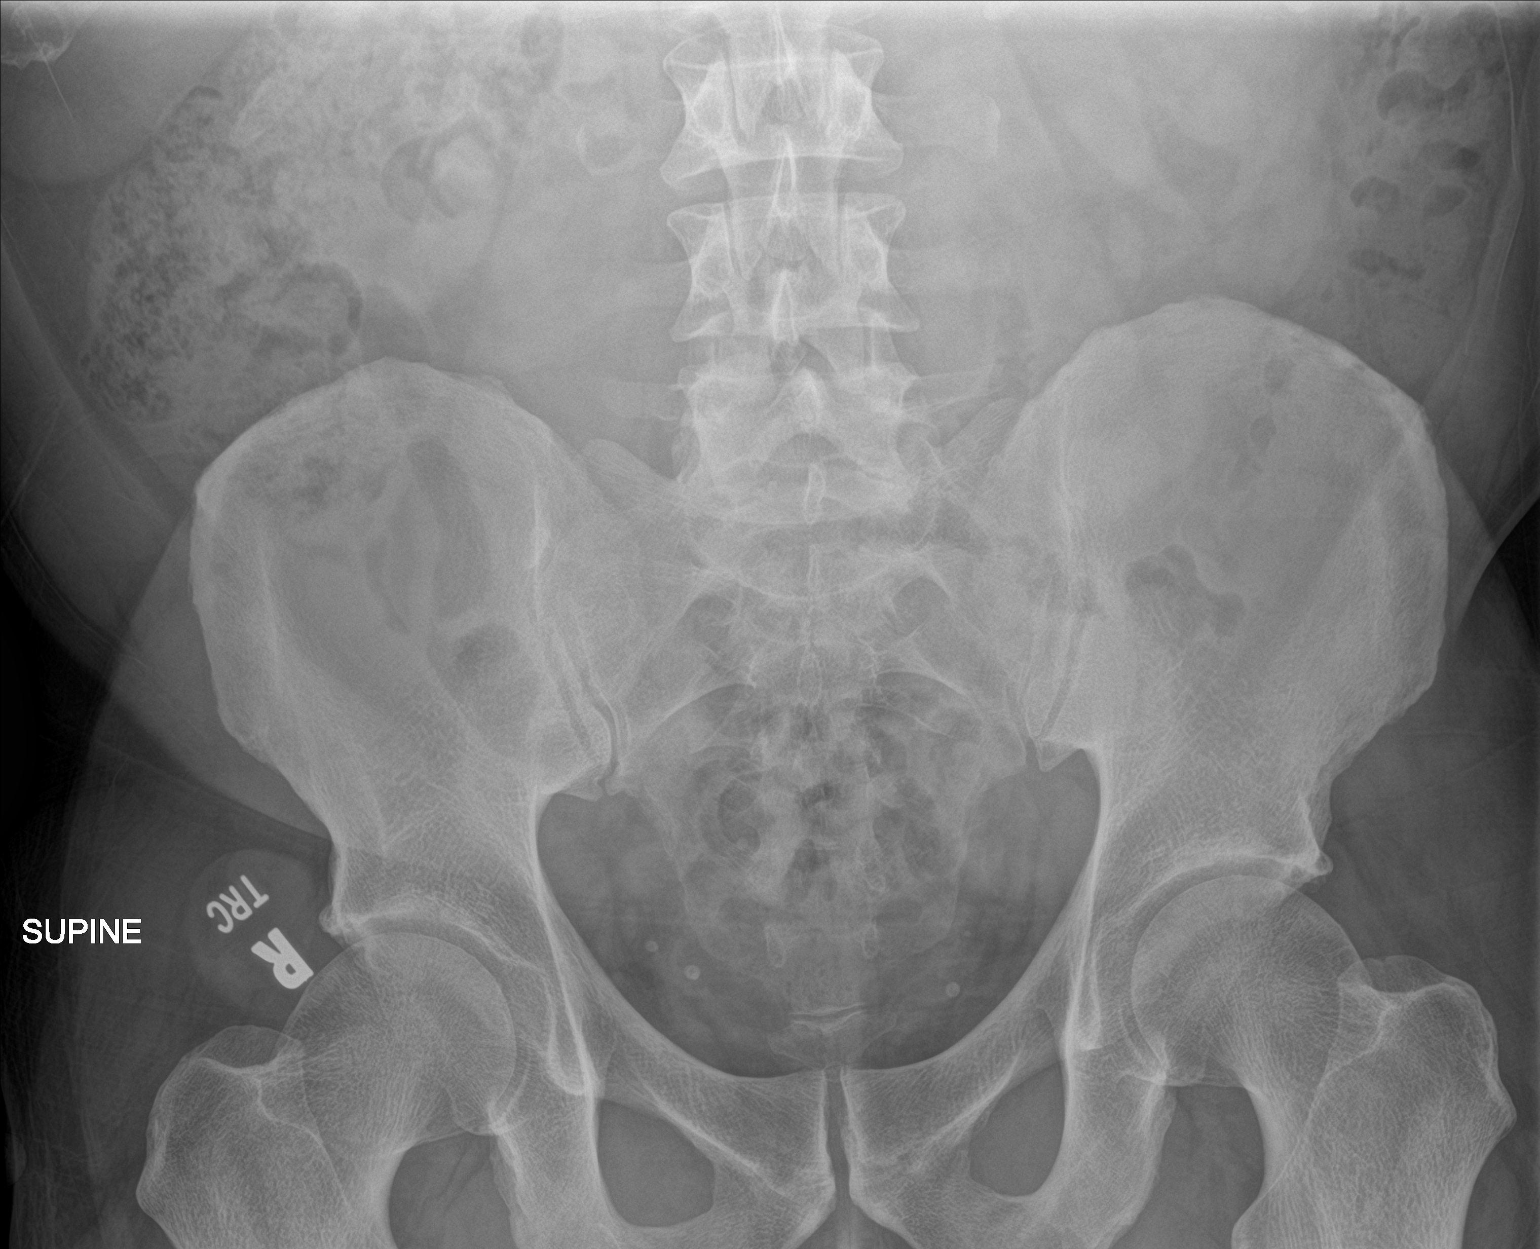
[im 2/2]
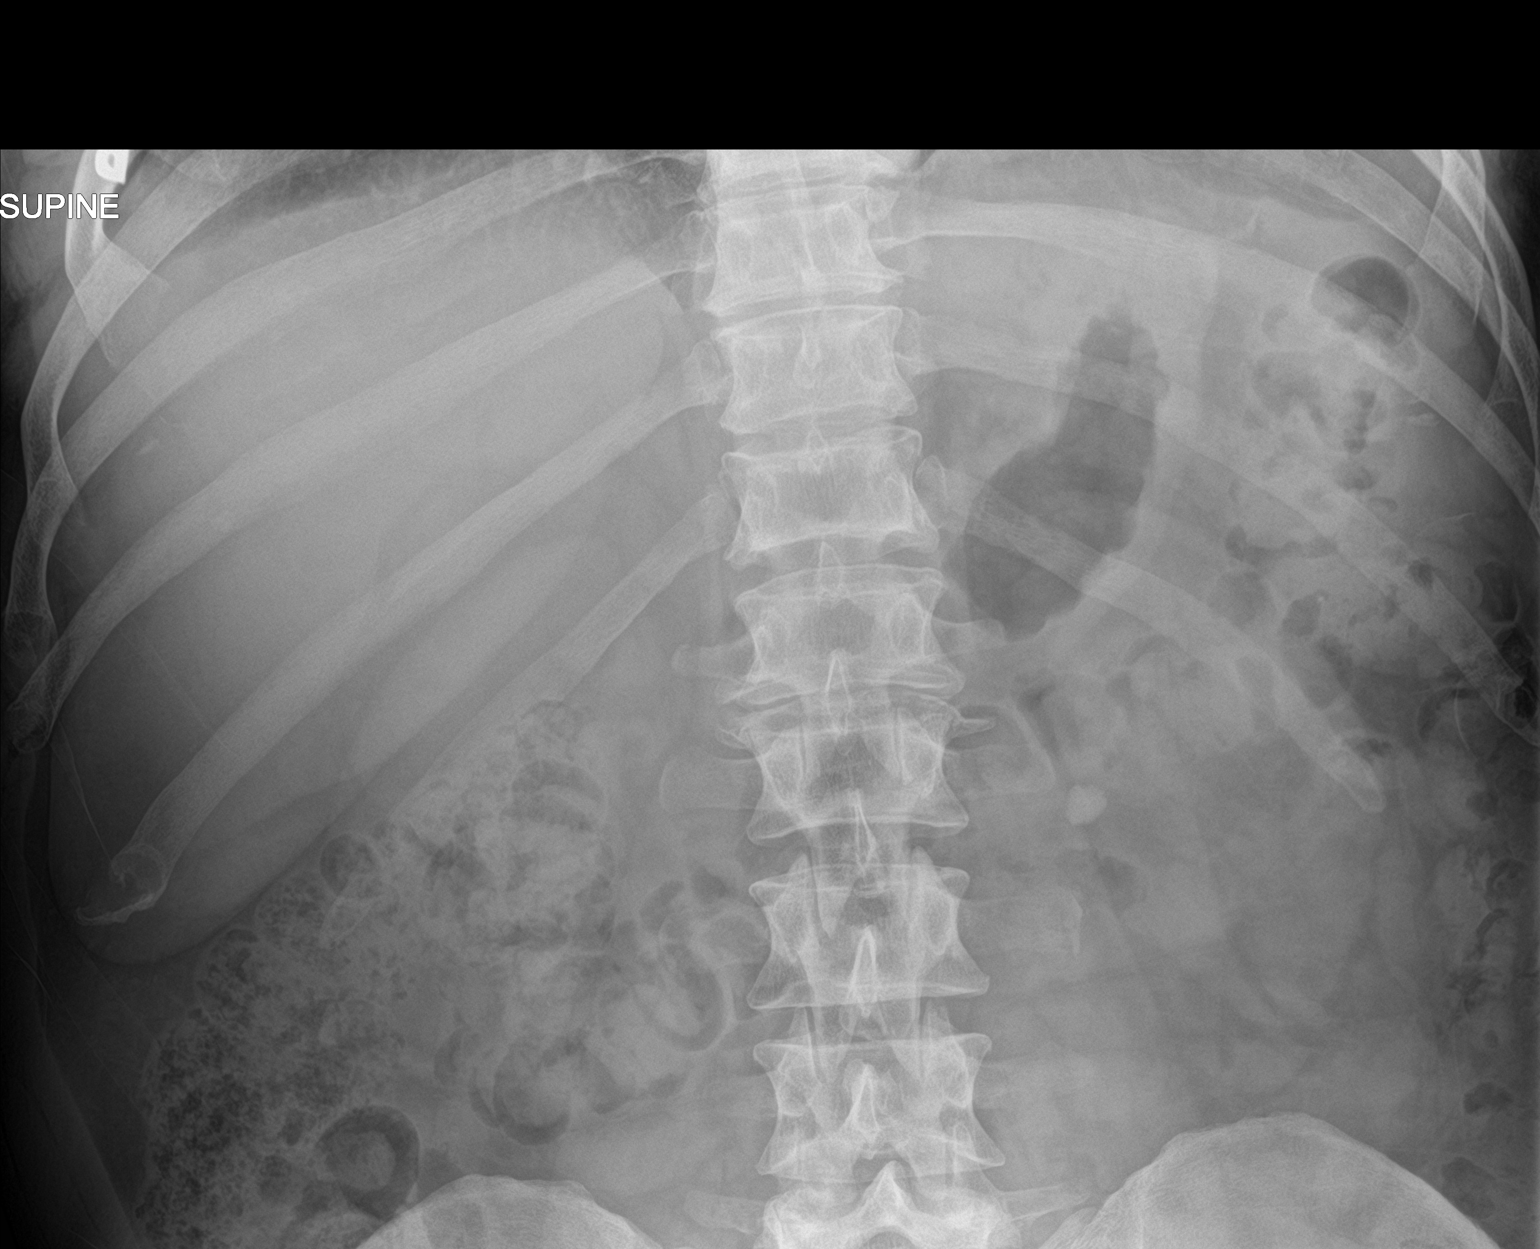

[2 of 2 positions shown; findings below may reference images not displayed]

FINDINGS: 10 mm stone noted over the left renal pelvis. Pelvic calcifications
consistent phleboliths. No bowel distention. Stool noted throughout
the colon. No free air. No acute bony abnormality.
IMPRESSION: 10 mm stone noted over the left renal pelvis.

## 2022-02-07 LAB — ACID FAST CULTURE WITH REFLEXED SENSITIVITIES (MYCOBACTERIA): Acid Fast Culture: NEGATIVE

## 2022-04-17 ENCOUNTER — Encounter: Payer: Self-pay | Admitting: Infectious Diseases

## 2022-05-28 ENCOUNTER — Encounter: Payer: Self-pay | Admitting: Dermatology

## 2022-05-28 ENCOUNTER — Ambulatory Visit: Payer: BC Managed Care – PPO | Admitting: Dermatology

## 2022-05-28 DIAGNOSIS — Z1283 Encounter for screening for malignant neoplasm of skin: Secondary | ICD-10-CM | POA: Diagnosis not present

## 2022-05-28 DIAGNOSIS — L821 Other seborrheic keratosis: Secondary | ICD-10-CM

## 2022-05-28 DIAGNOSIS — L853 Xerosis cutis: Secondary | ICD-10-CM

## 2022-05-28 DIAGNOSIS — L578 Other skin changes due to chronic exposure to nonionizing radiation: Secondary | ICD-10-CM

## 2022-05-28 DIAGNOSIS — L570 Actinic keratosis: Secondary | ICD-10-CM | POA: Diagnosis not present

## 2022-05-28 DIAGNOSIS — L814 Other melanin hyperpigmentation: Secondary | ICD-10-CM

## 2022-05-28 DIAGNOSIS — D229 Melanocytic nevi, unspecified: Secondary | ICD-10-CM

## 2022-05-28 NOTE — Progress Notes (Unsigned)
Follow-Up Visit   Subjective  Cory Johnston is a 51 y.o. male who presents for the following: Annual Exam (Hx of actinic keratoses). The patient presents for Total-Body Skin Exam (TBSE) for skin cancer screening and mole check.  The patient has spots, moles and lesions to be evaluated, some may be new or changing and the patient has concerns that these could be cancer.  The following portions of the chart were reviewed this encounter and updated as appropriate:  Tobacco  Allergies  Meds  Problems  Med Hx  Surg Hx  Fam Hx     Review of Systems: No other skin or systemic complaints except as noted in HPI or Assessment and Plan.  Objective  Well appearing patient in no apparent distress; mood and affect are within normal limits.  A full examination was performed including scalp, head, eyes, ears, nose, lips, neck, chest, axillae, abdomen, back, buttocks, bilateral upper extremities, bilateral lower extremities, hands, feet, fingers, toes, fingernails, and toenails. All findings within normal limits unless otherwise noted below.  Scalp x1, left helix x1, left neck x1, right neck x1 (4) Erythematous thin papules/macules with gritty scale.   Left Elbow - Posterior Moderate xerosis   Assessment & Plan   Lentigines - Scattered tan macules - Due to sun exposure - Benign-appearing, observe - Recommend daily broad spectrum sunscreen SPF 30+ to sun-exposed areas, reapply every 2 hours as needed. - Call for any changes  Seborrheic Keratoses - Stuck-on, waxy, tan-brown papules and/or plaques  - Benign-appearing - Discussed benign etiology and prognosis. - Observe - Call for any changes  Melanocytic Nevi - Tan-brown and/or pink-flesh-colored symmetric macules and papules - Benign appearing on exam today - Observation - Call clinic for new or changing moles - Recommend daily use of broad spectrum spf 30+ sunscreen to sun-exposed areas.   Hemangiomas - Red papules - Discussed  benign nature - Observe - Call for any changes  Actinic Damage - Chronic condition, secondary to cumulative UV/sun exposure - diffuse scaly erythematous macules with underlying dyspigmentation - Recommend daily broad spectrum sunscreen SPF 30+ to sun-exposed areas, reapply every 2 hours as needed.  - Staying in the shade or wearing long sleeves, sun glasses (UVA+UVB protection) and wide brim hats (4-inch brim around the entire circumference of the hat) are also recommended for sun protection.  - Call for new or changing lesions.  Skin cancer screening performed today.  AK (actinic keratosis) (4) Scalp x1, left helix x1, left neck x1, right neck x1  Actinic keratoses are precancerous spots that appear secondary to cumulative UV radiation exposure/sun exposure over time. They are chronic with expected duration over 1 year. A portion of actinic keratoses will progress to squamous cell carcinoma of the skin. It is not possible to reliably predict which spots will progress to skin cancer and so treatment is recommended to prevent development of skin cancer.  Recommend daily broad spectrum sunscreen SPF 30+ to sun-exposed areas, reapply every 2 hours as needed.  Recommend staying in the shade or wearing long sleeves, sun glasses (UVA+UVB protection) and wide brim hats (4-inch brim around the entire circumference of the hat). Call for new or changing lesions.  Destruction of lesion - Scalp x1, left helix x1, left neck x1, right neck x1 Complexity: simple   Destruction method: cryotherapy   Informed consent: discussed and consent obtained   Timeout:  patient name, date of birth, surgical site, and procedure verified Lesion destroyed using liquid nitrogen: Yes  Region frozen until ice ball extended beyond lesion: Yes   Outcome: patient tolerated procedure well with no complications   Post-procedure details: wound care instructions given   Additional details:  Prior to procedure, discussed  risks of blister formation, small wound, skin dyspigmentation, or rare scar following cryotherapy. Recommend Vaseline ointment to treated areas while healing.   Xerosis cutis Left Elbow - Posterior Vs minimal psoriasis Recommend using CeraVe cream.   Return in about 1 year (around 05/29/2023) for TBSE.  I, Emelia Salisbury, CMA, am acting as scribe for Sarina Ser, MD. Documentation: I have reviewed the above documentation for accuracy and completeness, and I agree with the above.  Sarina Ser, MD

## 2022-05-28 NOTE — Patient Instructions (Addendum)
Cryotherapy Aftercare  Wash gently with soap and water everyday.   Apply Vaseline daily until healed.    Recommend daily broad spectrum sunscreen SPF 30+ to sun-exposed areas, reapply every 2 hours as needed. Call for new or changing lesions.  Staying in the shade or wearing long sleeves, sun glasses (UVA+UVB protection) and wide brim hats (4-inch brim around the entire circumference of the hat) are also recommended for sun protection.    Melanoma ABCDEs  Melanoma is the most dangerous type of skin cancer, and is the leading cause of death from skin disease.  You are more likely to develop melanoma if you: Have light-colored skin, light-colored eyes, or red or blond hair Spend a lot of time in the sun Tan regularly, either outdoors or in a tanning bed Have had blistering sunburns, especially during childhood Have a close family member who has had a melanoma Have atypical moles or large birthmarks  Early detection of melanoma is key since treatment is typically straightforward and cure rates are extremely high if we catch it early.   The first sign of melanoma is often a change in a mole or a new dark spot.  The ABCDE system is a way of remembering the signs of melanoma.  A for asymmetry:  The two halves do not match. B for border:  The edges of the growth are irregular. C for color:  A mixture of colors are present instead of an even brown color. D for diameter:  Melanomas are usually (but not always) greater than 6mm - the size of a pencil eraser. E for evolution:  The spot keeps changing in size, shape, and color.  Please check your skin once per month between visits. You can use a small mirror in front and a large mirror behind you to keep an eye on the back side or your body.   If you see any new or changing lesions before your next follow-up, please call to schedule a visit.  Please continue daily skin protection including broad spectrum sunscreen SPF 30+ to sun-exposed areas,  reapplying every 2 hours as needed when you're outdoors.   Staying in the shade or wearing long sleeves, sun glasses (UVA+UVB protection) and wide brim hats (4-inch brim around the entire circumference of the hat) are also recommended for sun protection.       Due to recent changes in healthcare laws, you may see results of your pathology and/or laboratory studies on MyChart before the doctors have had a chance to review them. We understand that in some cases there may be results that are confusing or concerning to you. Please understand that not all results are received at the same time and often the doctors may need to interpret multiple results in order to provide you with the best plan of care or course of treatment. Therefore, we ask that you please give us 2 business days to thoroughly review all your results before contacting the office for clarification. Should we see a critical lab result, you will be contacted sooner.   If You Need Anything After Your Visit  If you have any questions or concerns for your doctor, please call our main line at [phone] and press option 4 to reach your doctor's medical assistant. If no one answers, please leave a voicemail as directed and we will return your call as soon as possible. Messages left after 4 pm will be answered the following business day.   You may also send us a message   via MyChart. We typically respond to MyChart messages within 1-2 business days.  For prescription refills, please ask your pharmacy to contact our office. Our fax number is [phone].  If you have an urgent issue when the clinic is closed that cannot wait until the next business day, you can page your doctor at the number below.    Please note that while we do our best to be available for urgent issues outside of office hours, we are not available 24/7.   If you have an urgent issue and are unable to reach us, you may choose to seek medical care at your doctor's  office, retail clinic, urgent care center, or emergency room.  If you have a medical emergency, please immediately call 911 or go to the emergency department.  Pager Numbers  - Dr. Kowalski: [phone]  - Dr. Moye: [phone]  - Dr. Stewart: [phone]  In the event of inclement weather, please call our main line at [phone] for an update on the status of any delays or closures.  Dermatology Medication Tips: Please keep the boxes that topical medications come in in order to help keep track of the instructions about where and how to use these. Pharmacies typically print the medication instructions only on the boxes and not directly on the medication tubes.   If your medication is too expensive, please contact our office at [phone] option 4 or send us a message through MyChart.   We are unable to tell what your co-pay for medications will be in advance as this is different depending on your insurance coverage. However, we may be able to find a substitute medication at lower cost or fill out paperwork to get insurance to cover a needed medication.   If a prior authorization is required to get your medication covered by your insurance company, please allow us 1-2 business days to complete this process.  Drug prices often vary depending on where the prescription is filled and some pharmacies may offer cheaper prices.  The website www.goodrx.com contains coupons for medications through different pharmacies. The prices here do not account for what the cost may be with help from insurance (it may be cheaper with your insurance), but the website can give you the price if you did not use any insurance.  - You can print the associated coupon and take it with your prescription to the pharmacy.  - You may also stop by our office during regular business hours and pick up a GoodRx coupon card.  - If you need your prescription sent electronically to a different pharmacy, notify our office  through [facility] MyChart or by phone at [phone] option 4.     Si Usted Necesita Algo Despus de Su Visita  Tambin puede enviarnos un mensaje a travs de MyChart. Por lo general respondemos a los mensajes de MyChart en el transcurso de 1 a 2 das hbiles.  Para renovar recetas, por favor pida a su farmacia que se ponga en contacto con nuestra oficina. Nuestro nmero de fax es el [phone].  Si tiene un asunto urgente cuando la clnica est cerrada y que no puede esperar hasta el siguiente da hbil, puede llamar/localizar a su doctor(a) al nmero que aparece a continuacin.   Por favor, tenga en cuenta que aunque hacemos todo lo posible para estar disponibles para asuntos urgentes fuera del horario de oficina, no estamos disponibles las 24 horas del da, los 7 das de la semana.   Si tiene un problema urgente y   no puede comunicarse con nosotros, puede optar por buscar atencin mdica  en el consultorio de su doctor(a), en una clnica privada, en un centro de atencin urgente o en una sala de emergencias.  Si tiene una emergencia mdica, por favor llame inmediatamente al 911 o vaya a la sala de emergencias.  Nmeros de bper  - Dr. Kowalski: [phone]  - Dra. Moye: [phone]  - Dra. Stewart: [phone]  En caso de inclemencias del tiempo, por favor llame a nuestra lnea principal al [phone] para una actualizacin sobre el estado de cualquier retraso o cierre.  Consejos para la medicacin en dermatologa: Por favor, guarde las cajas en las que vienen los medicamentos de uso tpico para ayudarle a seguir las instrucciones sobre dnde y cmo usarlos. Las farmacias generalmente imprimen las instrucciones del medicamento slo en las cajas y no directamente en los tubos del medicamento.   Si su medicamento es muy caro, por favor, pngase en contacto con nuestra oficina llamando al [phone] y presione la opcin 4 o envenos un mensaje a travs de MyChart.   No  podemos decirle cul ser su copago por los medicamentos por adelantado ya que esto es diferente dependiendo de la cobertura de su seguro. Sin embargo, es posible que podamos encontrar un medicamento sustituto a menor costo o llenar un formulario para que el seguro cubra el medicamento que se considera necesario.   Si se requiere una autorizacin previa para que su compaa de seguros cubra su medicamento, por favor permtanos de 1 a 2 das hbiles para completar este proceso.  Los precios de los medicamentos varan con frecuencia dependiendo del lugar de dnde se surte la receta y alguna farmacias pueden ofrecer precios ms baratos.  El sitio web www.goodrx.com tiene cupones para medicamentos de diferentes farmacias. Los precios aqu no tienen en cuenta lo que podra costar con la ayuda del seguro (puede ser ms barato con su seguro), pero el sitio web puede darle el precio si no utiliz ningn seguro.  - Puede imprimir el cupn correspondiente y llevarlo con su receta a la farmacia.  - Tambin puede pasar por nuestra oficina durante el horario de atencin regular y recoger una tarjeta de cupones de GoodRx.  - Si necesita que su receta se enve electrnicamente a una farmacia diferente, informe a nuestra oficina a travs de MyChart de [facility] [place] o por telfono llamando al [phone] y presione la opcin 4.  

## 2022-05-30 ENCOUNTER — Encounter: Payer: Self-pay | Admitting: Dermatology

## 2022-06-26 ENCOUNTER — Ambulatory Visit
Admission: RE | Admit: 2022-06-26 | Discharge: 2022-06-26 | Disposition: A | Discharge status: Home / Self Care | Payer: BC Managed Care – PPO | Source: Ambulatory Visit | Attending: Emergency Medicine | Admitting: Emergency Medicine

## 2022-06-26 VITALS — BP 163/108 | HR 82 | Temp 98.5°F | Ht 72.0 in | Wt 230.0 lb

## 2022-06-26 DIAGNOSIS — L03111 Cellulitis of right axilla: Secondary | ICD-10-CM | POA: Diagnosis not present

## 2022-06-26 MED ORDER — DOXYCYCLINE HYCLATE 100 MG PO CAPS: 100.0000 mg | ORAL_CAPSULE | Freq: Two times a day (BID) | ORAL | 0 refills | Status: DC

## 2022-06-26 NOTE — Discharge Instructions (Signed)
Take the Doxycycline twice daily with food for 10 days.  Doxycycline will make you more sensitive to sunburn so wear sunscreen when outdoors and reapply it every 90 minutes.  Apply warm compresses to help promote drainage.  Use OTC Tylenol and Ibuprofen according to the package instructions as needed for pain.  Return for new or worsening symptoms.

## 2022-06-26 NOTE — ED Triage Notes (Signed)
Pt c/o cysts RT underarm, pt states this has been something recurrent. Pt states he noticed them pop up within last 2 weeks

## 2022-06-26 NOTE — ED Provider Notes (Signed)
[facility]    CSN: [id number] Arrival date & time: 06/26/22  0857      History   Chief Complaint Chief Complaint  Patient presents with   Cyst    RT underarm     HPI Cory Johnston is a 51 y.o. male.   HPI  52 year old male here for evaluation of skin complaint.  Patient reports that he has developed couple areas of swelling in his right axilla that been present for the last 2 weeks.  He states that one of the lesions has ruptured and drained a thick yellow and white sebum like discharge.  He has 2 other areas present and only 1 of which is draining currently.  He has not had a fever.  He has had similar issues in the past and has been diagnosed with MRSA in the past.  A year ago he wanted admitted to the hospital and want having to go to the ICU secondary to a MRSA abscess so he came in for evaluation.  Past Medical History:  Diagnosis Date   Acute diverticulitis 10/15/2017   Anxiety    Complication of anesthesia    Elevated ferritin 08/04/2015   Fatty liver    Gout    hx of   Hallux valgus of left foot    History of actinic keratosis 05/14/2016   left post auricular neck, bx proven   Hyperlipemia, mixed    Hypertension    controlled on meds   Nephrolithiasis    PONV (postoperative nausea and vomiting)    Sleep apnea    cpap    Patient Active Problem List   Diagnosis Date Noted   Stage 3a chronic kidney disease (CKD) ([place]) 12/29/2021   Obesity (BMI 30-39.9) 12/29/2021   Prediabetes 12/29/2021   Scrotal abscess 12/24/2021   Diverticulitis of large intestine without perforation or abscess without bleeding    Polyp of sigmoid colon    Benign neoplasm of transverse colon    History of diverticulitis 12/08/2017   Hypercholesterolemia 10/17/2017   Hypertension 10/17/2017   Hypogonadism in male 10/17/2017   Acute diverticulitis 10/15/2017   Other specified anxiety disorders 02/18/2017   Elevated liver enzymes 10/17/2016   Obstructive sleep apnea  08/29/2016   Elevated ferritin 08/04/2015    Past Surgical History:  Procedure Laterality Date   AIKEN OSTEOTOMY Left 02/01/2016   Procedure: Barbie Banner OSTEOTOMY [place]IN LEFT 1ST METATARSAL;  Surgeon: Samara Deist, DPM;  Location: [place];  Service: Podiatry;  Laterality: Left;  WITH POPLITEAL CPAP   ANKLE FRACTURE SURGERY Left    COLONOSCOPY WITH PROPOFOL N/A 01/23/2018   Procedure: COLONOSCOPY WITH PROPOFOL;  Surgeon: Lucilla Lame, MD;  Location: [place];  Service: Endoscopy;  Laterality: N/A;   EXTRACORPOREAL SHOCK WAVE LITHOTRIPSY Left 10/20/2020   Procedure: EXTRACORPOREAL SHOCK WAVE LITHOTRIPSY (ESWL);  Surgeon: Billey Co, MD;  Location: [facility];  Service: Urology;  Laterality: Left;   POLYPECTOMY N/A 01/23/2018   Procedure: POLYPECTOMY;  Surgeon: Lucilla Lame, MD;  Location: [place];  Service: Endoscopy;  Laterality: N/A;   TONSILLECTOMY AND ADENOIDECTOMY     TRIGGER FINGER RELEASE     VASECTOMY         Home Medications    Prior to Admission medications   Medication Sig Start Date End Date Taking? Authorizing Provider  amLODipine (NORVASC) 10 MG tablet Take 10 mg by mouth daily.   Yes [provider]  doxycycline (VIBRAMYCIN) 100 MG capsule Take 1 capsule (100 mg  total) by mouth 2 (two) times daily. 06/26/22  Yes Margarette Canada, NP  escitalopram (LEXAPRO) 5 MG tablet Take 5 mg by mouth daily. 08/30/17  Yes [provider]  fenofibrate 160 MG tablet Take 160 mg by mouth daily. 07/16/17  Yes [provider]  icosapent Ethyl (VASCEPA) 1 g capsule Take 2 capsules by mouth 2 (two) times daily. 12/26/21 12/26/22 Yes [provider]  Multiple Vitamins-Minerals (MULTIVITAMIN MEN) TABS Take 1 tablet by mouth daily.   Yes [provider]  niacin (SLO-NIACIN) 500 MG tablet Take 500 mg by mouth at bedtime.   Yes [provider]  psyllium (METAMUCIL) 58.6 % powder Take 1 packet by mouth 2 (two) times daily.    Yes [provider]  testosterone cypionate (DEPOTESTOSTERONE CYPIONATE) 200 MG/ML injection Inject 1 mL into the muscle every 14 (fourteen) days. 07/16/17  Yes [provider]  oxyCODONE (OXY IR/ROXICODONE) 5 MG immediate release tablet Take 1 tablet (5 mg total) by mouth every 6 (six) hours as needed for moderate pain. Patient not taking: Reported on 01/04/2022 12/27/21   Annita Brod, MD    Family History Family History  Problem Relation Age of Onset   Hypertension Father    Stroke Father    CAD Father    Hyperlipidemia Father    Diabetes Father    Stroke Paternal Aunt     Social History Social History   Tobacco Use   Smoking status: Never   Smokeless tobacco: Never  Vaping Use   Vaping Use: Never used  Substance Use Topics   Alcohol use: Not Currently    Alcohol/week: 14.0 standard drinks of alcohol    Types: 12 Cans of beer, 2 Shots of liquor per week    Comment: 3 x week beer and liquor   Drug use: No     Allergies   Patient has no known allergies.   Review of Systems Review of Systems  Constitutional:  Negative for fever.  Skin:  Positive for color change and wound.  Hematological: Negative.   Psychiatric/Behavioral: Negative.       Physical Exam Triage Vital Signs ED Triage Vitals  Enc Vitals Group     BP 06/26/22 0922 (!) 163/108     Pulse Rate 06/26/22 0922 82     Resp --      Temp 06/26/22 0922 98.5 F (36.9 C)     Temp Source 06/26/22 0922 Oral     SpO2 06/26/22 0922 95 %     Weight 06/26/22 0921 230 lb (104.3 kg)     Height 06/26/22 0921 6' (1.829 m)     Head Circumference --      Peak Flow --      Pain Score 06/26/22 0921 4     Pain Loc --      Pain Edu? --      Excl. in [place]? --    No data found.  Updated Vital Signs BP (!) 163/108 (BP Location: Right Arm)   Pulse 82   Temp 98.5 F (36.9 C) (Oral)   Ht 6' (1.829 m)   Wt 230 lb (104.3 kg)   SpO2 95%   BMI 31.19 kg/m   Visual Acuity Right Eye Distance:    Left Eye Distance:   Bilateral Distance:    Right Eye Near:   Left Eye Near:    Bilateral Near:     Physical Exam Vitals and nursing note reviewed.  Constitutional:  Appearance: Normal appearance. He is not ill-appearing.  HENT:     Head: Normocephalic and atraumatic.  Skin:    General: Skin is warm and dry.     Capillary Refill: Capillary refill takes less than 2 seconds.     Findings: Erythema and lesion present.     Comments: 2 erythematous lesions in the right axilla.  The posterior lesion is approximately size of a ping-pong ball and is hard and indurated but tender to touch.  The anterior lesion is approximate size of a pea and is draining a thick serosanguineous pus.  No axillary lymphadenopathy appreciated on exam.  Neurological:     General: No focal deficit present.     Mental Status: He is alert and oriented to person, place, and time.  Psychiatric:        Mood and Affect: Mood normal.        Behavior: Behavior normal.        Thought Content: Thought content normal.        Judgment: Judgment normal.      UC Treatments / Results  Labs (all labs ordered are listed, but only abnormal results are displayed) Labs Reviewed - No data to display  EKG   Radiology No results found.  Procedures Procedures (including critical care time)  Medications Ordered in UC Medications - No data to display  Initial Impression / Assessment and Plan / UC Course  I have reviewed the triage vital signs and the nursing notes.  Pertinent labs & imaging results that were available during my care of the patient were reviewed by me and considered in my medical decision making (see chart for details).   Patient is a pleasant, nontoxic-appearing 51 year old male here for evaluation of 2 swollen areas in his right axilla.  As visible in the image above there is an anterior and a posterior lesion.  The anterior lesion is draining a thick, serosanguineous pus.  The posterior lesion  is hard and indurated without any fluctuance.  I will treat the patient for cellulitis and cover him for possible MRSA, given his history of same, with doxycycline 100 mg twice daily for 10 days.  I have also encouraged the patient to apply moist compresses to the posterior lesion to see if he can get the infection to come to ahead and rupture on its own.  If it does not rupture on its own and the swelling continues he can return for reevaluation and possible I&D.  Note provided.   Final Clinical Impressions(s) / UC Diagnoses   Final diagnoses:  Cellulitis of axilla, right     Discharge Instructions      Take the Doxycycline twice daily with food for 10 days.  Doxycycline will make you more sensitive to sunburn so wear sunscreen when outdoors and reapply it every 90 minutes.  Apply warm compresses to help promote drainage.  Use OTC Tylenol and Ibuprofen according to the package instructions as needed for pain.  Return for new or worsening symptoms.       ED Prescriptions     Medication Sig Dispense Auth. Provider   doxycycline (VIBRAMYCIN) 100 MG capsule Take 1 capsule (100 mg total) by mouth 2 (two) times daily. 20 capsule Margarette Canada, NP      PDMP not reviewed this encounter.   Margarette Canada, NP 06/26/22 [phone]

## 2022-10-17 ENCOUNTER — Ambulatory Visit
Admission: RE | Admit: 2022-10-17 | Discharge: 2022-10-17 | Disposition: A | Discharge status: Home / Self Care | Payer: BC Managed Care – PPO | Source: Ambulatory Visit | Attending: Physician Assistant | Admitting: Physician Assistant

## 2022-10-17 VITALS — BP 153/96 | HR 93 | Temp 98.7°F | Resp 16 | Ht 72.0 in | Wt 229.9 lb

## 2022-10-17 DIAGNOSIS — H00014 Hordeolum externum left upper eyelid: Secondary | ICD-10-CM | POA: Diagnosis not present

## 2022-10-17 DIAGNOSIS — Z8614 Personal history of Methicillin resistant Staphylococcus aureus infection: Secondary | ICD-10-CM

## 2022-10-17 MED ORDER — DOXYCYCLINE HYCLATE 100 MG PO CAPS: 100.0000 mg | ORAL_CAPSULE | Freq: Two times a day (BID) | ORAL | 0 refills | Status: AC

## 2022-10-17 MED ORDER — ERYTHROMYCIN 5 MG/GM OP OINT: TOPICAL_OINTMENT | OPHTHALMIC | 0 refills | Status: AC

## 2022-10-17 NOTE — Discharge Instructions (Addendum)
-  You have a stye of the left eye and a developing 1 on the right side. - I sent antibiotic ointment to the pharmacy.  Use as directed and continue with warm compresses. - I printed prescription for an antibiotic that covers MRSA in case your symptoms worsen or do not improve in the next 3 days or so.  I do not think you will need this prescription but you have it in case. - Go to ER for severe acute worsening of symptoms or fever.

## 2022-10-17 NOTE — ED Provider Notes (Signed)
[facility]    CSN: [id number] Arrival date & time: 10/17/22  1337      History   Chief Complaint Chief Complaint  Patient presents with   Eye Problem    HPI Cory Johnston is a 52 y.o. male presenting for bilateral upper inner eyelid redness and swelling for the past few days. States he has itching and pain. Denies drainage from eye. Reports some crusting at times. No vision changes. Denies contact with any known allergens. Has been using warm compresses.   HPI  Past Medical History:  Diagnosis Date   Acute diverticulitis 10/15/2017   Anxiety    Complication of anesthesia    Elevated ferritin 08/04/2015   Fatty liver    Gout    hx of   Hallux valgus of left foot    History of actinic keratosis 05/14/2016   left post auricular neck, bx proven   Hyperlipemia, mixed    Hypertension    controlled on meds   Nephrolithiasis    PONV (postoperative nausea and vomiting)    Sleep apnea    cpap    Patient Active Problem List   Diagnosis Date Noted   Stage 3a chronic kidney disease (CKD) ([place]) 12/29/2021   Obesity (BMI 30-39.9) 12/29/2021   Prediabetes 12/29/2021   Scrotal abscess 12/24/2021   Diverticulitis of large intestine without perforation or abscess without bleeding    Polyp of sigmoid colon    Benign neoplasm of transverse colon    History of diverticulitis 12/08/2017   Hypercholesterolemia 10/17/2017   Hypertension 10/17/2017   Hypogonadism in male 10/17/2017   Acute diverticulitis 10/15/2017   Other specified anxiety disorders 02/18/2017   Elevated liver enzymes 10/17/2016   Obstructive sleep apnea 08/29/2016   Elevated ferritin 08/04/2015    Past Surgical History:  Procedure Laterality Date   AIKEN OSTEOTOMY Left 02/01/2016   Procedure: Barbie Banner OSTEOTOMY [place] LEFT 1ST METATARSAL;  Surgeon: Samara Deist, DPM;  Location: [place];  Service: Podiatry;  Laterality: Left;  WITH POPLITEAL CPAP   ANKLE FRACTURE SURGERY Left     COLONOSCOPY WITH PROPOFOL N/A 01/23/2018   Procedure: COLONOSCOPY WITH PROPOFOL;  Surgeon: Lucilla Lame, MD;  Location: [place];  Service: Endoscopy;  Laterality: N/A;   EXTRACORPOREAL SHOCK WAVE LITHOTRIPSY Left 10/20/2020   Procedure: EXTRACORPOREAL SHOCK WAVE LITHOTRIPSY (ESWL);  Surgeon: Billey Co, MD;  Location: [facility];  Service: Urology;  Laterality: Left;   POLYPECTOMY N/A 01/23/2018   Procedure: POLYPECTOMY;  Surgeon: Lucilla Lame, MD;  Location: [place];  Service: Endoscopy;  Laterality: N/A;   TONSILLECTOMY AND ADENOIDECTOMY     TRIGGER FINGER RELEASE     VASECTOMY         Home Medications    Prior to Admission medications   Medication Sig Start Date End Date Taking? Authorizing Provider  amLODipine (NORVASC) 10 MG tablet Take 10 mg by mouth daily.   Yes [provider]  erythromycin ophthalmic ointment Place a 1/2 inch ribbon of ointment into the upper eyelids q6h. 10/17/22 10/24/22 Yes Laurene Footman B, PA-C  escitalopram (LEXAPRO) 5 MG tablet Take 5 mg by mouth daily. 08/30/17  Yes [provider]  fenofibrate 160 MG tablet Take 160 mg by mouth daily. 07/16/17  Yes [provider]  icosapent Ethyl (VASCEPA) 1 g capsule Take 2 capsules by mouth 2 (two) times daily. 12/26/21 12/26/22 Yes [provider]  Multiple Vitamins-Minerals (MULTIVITAMIN MEN) TABS Take 1 tablet by mouth daily.  Yes [provider]  testosterone cypionate (DEPOTESTOSTERONE CYPIONATE) 200 MG/ML injection Inject 1 mL into the muscle every 14 (fourteen) days. 07/16/17  Yes [provider]  doxycycline (VIBRAMYCIN) 100 MG capsule Take 1 capsule (100 mg total) by mouth 2 (two) times daily for 7 days. 10/17/22 10/24/22  Danton Clap, PA-C  niacin (SLO-NIACIN) 500 MG tablet Take 500 mg by mouth at bedtime.    [provider]  oxyCODONE (OXY IR/ROXICODONE) 5 MG immediate release tablet Take 1 tablet (5 mg total) by mouth every 6  (six) hours as needed for moderate pain. Patient not taking: Reported on 01/04/2022 12/27/21   Annita Brod, MD  psyllium (METAMUCIL) 58.6 % powder Take 1 packet by mouth 2 (two) times daily.    [provider]    Family History Family History  Problem Relation Age of Onset   Hypertension Father    Stroke Father    CAD Father    Hyperlipidemia Father    Diabetes Father    Stroke Paternal Aunt     Social History Social History   Tobacco Use   Smoking status: Never   Smokeless tobacco: Never  Vaping Use   Vaping Use: Never used  Substance Use Topics   Alcohol use: Not Currently    Alcohol/week: 14.0 standard drinks of alcohol    Types: 12 Cans of beer, 2 Shots of liquor per week    Comment: 3 x week beer and liquor   Drug use: No     Allergies   Patient has no known allergies.   Review of Systems Review of Systems  Constitutional:  Negative for fatigue and fever.  HENT:  Negative for congestion.   Eyes:  Positive for pain, redness and itching. Negative for photophobia, discharge and visual disturbance.  Neurological:  Negative for dizziness and headaches.     Physical Exam Triage Vital Signs ED Triage Vitals  Enc Vitals Group     BP      Pulse      Resp      Temp      Temp src      SpO2      Weight      Height      Head Circumference      Peak Flow      Pain Score      Pain Loc      Pain Edu?      Excl. in [place]?    No data found.  Updated Vital Signs BP (!) 153/96 (BP Location: Right Arm)   Pulse 93   Temp 98.7 F (37.1 C) (Oral)   Resp 16   Ht 6' (1.829 m)   Wt 229 lb 15 oz (104.3 kg)   SpO2 96%   BMI 31.19 kg/m   Visual Acuity Right Eye Distance: 20/40 uncorrected Left Eye Distance: 20/40 uncorrected Bilateral Distance: 20/25 uncorrected  Physical Exam Vitals and nursing note reviewed.  Constitutional:      General: He is not in acute distress.    Appearance: Normal appearance. He is well-developed. He is not  ill-appearing.  HENT:     Head: Normocephalic and atraumatic.     Nose: Nose normal.     Mouth/Throat:     Mouth: Mucous membranes are moist.     Pharynx: Oropharynx is clear.  Eyes:     General: No scleral icterus.       Right eye: No discharge.  Left eye: Hordeolum (tiny stye with erythematous area of inner upper eyelid) present.No discharge.     Conjunctiva/sclera: Conjunctivae normal.     Right eye: Right conjunctiva is not injected.     Left eye: Left conjunctiva is not injected.     Comments: Slight erythema without obvious stye right upper inner eyelid  Cardiovascular:     Rate and Rhythm: Normal rate and regular rhythm.     Heart sounds: Normal heart sounds.  Pulmonary:     Effort: Pulmonary effort is normal. No respiratory distress.     Breath sounds: Normal breath sounds.  Musculoskeletal:     Cervical back: Neck supple.  Skin:    General: Skin is warm and dry.     Capillary Refill: Capillary refill takes less than 2 seconds.  Neurological:     General: No focal deficit present.     Mental Status: He is alert. Mental status is at baseline.     Motor: No weakness.     Gait: Gait normal.  Psychiatric:        Mood and Affect: Mood normal.        Behavior: Behavior normal.      UC Treatments / Results  Labs (all labs ordered are listed, but only abnormal results are displayed) Labs Reviewed - No data to display  EKG   Radiology No results found.  Procedures Procedures (including critical care time)  Medications Ordered in UC Medications - No data to display  Initial Impression / Assessment and Plan / UC Course  I have reviewed the triage vital signs and the nursing notes.  Pertinent labs & imaging results that were available during my care of the patient were reviewed by me and considered in my medical decision making (see chart for details).   52 year old male presents for pain, pruritus, erythema and irritation of bilateral inner upper eyelids  which is worse on the left side.  On his exam he has a stye which is small of the left inner upper eyelid and tenderness.  Of the right side there is no obvious stye but he does have erythema and swelling of the inner upper right eyelid.  He reports a history of MRSA and has concerns for MRSA infection.  Low suspicion for that.  Will treat him at this time with having him continue warm compresses and erythromycin ointment.  I printed prescription for doxycycline in case his condition worsens or does not improve in the next couple of days.  ED precautions given.   Final Clinical Impressions(s) / UC Diagnoses   Final diagnoses:  Hordeolum externum of left upper eyelid  History of MRSA infection     Discharge Instructions      -You have a stye of the left eye and a developing 1 on the right side. - I sent antibiotic ointment to the pharmacy.  Use as directed and continue with warm compresses. - I printed prescription for an antibiotic that covers MRSA in case your symptoms worsen or do not improve in the next 3 days or so.  I do not think you will need this prescription but you have it in case. - Go to ER for severe acute worsening of symptoms or fever.     ED Prescriptions     Medication Sig Dispense Auth. Provider   erythromycin ophthalmic ointment Place a 1/2 inch ribbon of ointment into the upper eyelids q6h. 3.5 g Laurene Footman B, PA-C   doxycycline (VIBRAMYCIN) 100 MG capsule Take  1 capsule (100 mg total) by mouth 2 (two) times daily for 7 days. 14 capsule Danton Clap, PA-C      PDMP not reviewed this encounter.   Danton Clap, PA-C 10/17/22 1455

## 2022-10-17 NOTE — ED Triage Notes (Signed)
Pt states he feels like there is a knot on the inside corner of both of his eyes. He states his eyes are swollen and itchy. He states he has had styes int he past and this does not feel like a stye.

## 2023-05-30 ENCOUNTER — Ambulatory Visit: Payer: BC Managed Care – PPO | Admitting: Dermatology

## 2023-05-30 DIAGNOSIS — Z1283 Encounter for screening for malignant neoplasm of skin: Secondary | ICD-10-CM

## 2023-05-30 DIAGNOSIS — L570 Actinic keratosis: Secondary | ICD-10-CM

## 2023-05-30 DIAGNOSIS — L821 Other seborrheic keratosis: Secondary | ICD-10-CM

## 2023-05-30 DIAGNOSIS — L578 Other skin changes due to chronic exposure to nonionizing radiation: Secondary | ICD-10-CM

## 2023-05-30 DIAGNOSIS — Z872 Personal history of diseases of the skin and subcutaneous tissue: Secondary | ICD-10-CM

## 2023-05-30 DIAGNOSIS — L814 Other melanin hyperpigmentation: Secondary | ICD-10-CM

## 2023-05-30 DIAGNOSIS — W908XXA Exposure to other nonionizing radiation, initial encounter: Secondary | ICD-10-CM

## 2023-05-30 DIAGNOSIS — D229 Melanocytic nevi, unspecified: Secondary | ICD-10-CM

## 2023-05-30 DIAGNOSIS — D1801 Hemangioma of skin and subcutaneous tissue: Secondary | ICD-10-CM

## 2023-05-30 NOTE — Patient Instructions (Addendum)

## 2023-05-30 NOTE — Progress Notes (Signed)
   Follow-Up Visit   Subjective  Cory Johnston is a 52 y.o. male who presents for the following: Yearly Skin Cancer Screening and Full Body Skin Exam, hx of precancers.   The patient presents for Total-Body Skin Exam (TBSE) for skin cancer screening and mole check. The patient has spots, moles and lesions to be evaluated, some may be new or changing and the patient may have concern these could be cancer.    The following portions of the chart were reviewed this encounter and updated as appropriate: medications, allergies, medical history  Review of Systems:  No other skin or systemic complaints except as noted in HPI or Assessment and Plan.  Objective  Well appearing patient in no apparent distress; mood and affect are within normal limits.  A full examination was performed including scalp, head, eyes, ears, nose, lips, neck, chest, axillae, abdomen, back, buttocks, bilateral upper extremities, bilateral lower extremities, hands, feet, fingers, toes, fingernails, and toenails. All findings within normal limits unless otherwise noted below.   Relevant physical exam findings are noted in the Assessment and Plan.  left forehead x 1, right neck post auricular x 2, left post auricular x 2 (5) Erythematous thin papules/macules with gritty scale.     Assessment & Plan   SKIN CANCER SCREENING PERFORMED TODAY.  ACTINIC DAMAGE - Chronic condition, secondary to cumulative UV/sun exposure - diffuse scaly erythematous macules with underlying dyspigmentation - Recommend daily broad spectrum sunscreen SPF 30+ to sun-exposed areas, reapply every 2 hours as needed.  - Staying in the shade or wearing long sleeves, sun glasses (UVA+UVB protection) and wide brim hats (4-inch brim around the entire circumference of the hat) are also recommended for sun protection.  - Call for new or changing lesions.  LENTIGINES, SEBORRHEIC KERATOSES, HEMANGIOMAS - Benign normal skin lesions - Benign-appearing -  Call for any changes  MELANOCYTIC NEVI - Tan-brown and/or pink-flesh-colored symmetric macules and papules - Benign appearing on exam today - Observation - Call clinic for new or changing moles - Recommend daily use of broad spectrum spf 30+ sunscreen to sun-exposed areas.    AK (actinic keratosis) (5) left forehead x 1, right neck post auricular x 2, left post auricular x 2  Destruction of lesion - left forehead x 1, right neck post auricular x 2, left post auricular x 2 (5) Complexity: simple   Destruction method: cryotherapy   Informed consent: discussed and consent obtained   Timeout:  patient name, date of birth, surgical site, and procedure verified Lesion destroyed using liquid nitrogen: Yes   Region frozen until ice ball extended beyond lesion: Yes   Outcome: patient tolerated procedure well with no complications   Post-procedure details: wound care instructions given     Return in about 1 year (around 05/29/2024) for TBSE, hx of Aks .  IAngelique Holm, CMA, am acting as scribe for Armida Sans, MD .   Documentation: I have reviewed the above documentation for accuracy and completeness, and I agree with the above.  Armida Sans, MD

## 2023-06-01 ENCOUNTER — Encounter: Payer: Self-pay | Admitting: Dermatology

## 2023-08-19 ENCOUNTER — Encounter: Payer: Self-pay | Admitting: Internal Medicine

## 2023-08-19 ENCOUNTER — Other Ambulatory Visit: Payer: Self-pay | Admitting: Internal Medicine

## 2023-08-19 DIAGNOSIS — R519 Headache, unspecified: Secondary | ICD-10-CM

## 2023-08-19 DIAGNOSIS — I10 Essential (primary) hypertension: Secondary | ICD-10-CM

## 2023-09-03 ENCOUNTER — Other Ambulatory Visit: Payer: BC Managed Care – PPO

## 2023-09-03 ENCOUNTER — Ambulatory Visit: Admission: RE | Admit: 2023-09-03 | Payer: BC Managed Care – PPO | Source: Ambulatory Visit

## 2023-09-04 ENCOUNTER — Ambulatory Visit
Admission: RE | Admit: 2023-09-04 | Discharge: 2023-09-04 | Disposition: A | Discharge status: Home / Self Care | Payer: BC Managed Care – PPO | Source: Ambulatory Visit | Attending: Internal Medicine | Admitting: Internal Medicine

## 2023-09-04 DIAGNOSIS — R519 Headache, unspecified: Secondary | ICD-10-CM | POA: Diagnosis present

## 2023-09-04 DIAGNOSIS — I10 Essential (primary) hypertension: Secondary | ICD-10-CM | POA: Diagnosis present

## 2023-09-04 MED ORDER — IOHEXOL 300 MG/ML  SOLN
75.0000 mL | Freq: Once | INTRAMUSCULAR | Status: AC | PRN
  Administered 2023-09-04: 75 mL via INTRAVENOUS

## 2023-10-11 ENCOUNTER — Ambulatory Visit: Payer: BC Managed Care – PPO | Admitting: Dietician

## 2023-11-22 ENCOUNTER — Ambulatory Visit: Payer: BC Managed Care – PPO | Admitting: Dietician

## 2024-04-13 ENCOUNTER — Ambulatory Visit (INDEPENDENT_AMBULATORY_CARE_PROVIDER_SITE_OTHER): Admitting: Dermatology

## 2024-04-13 DIAGNOSIS — W908XXA Exposure to other nonionizing radiation, initial encounter: Secondary | ICD-10-CM

## 2024-04-13 DIAGNOSIS — Z1283 Encounter for screening for malignant neoplasm of skin: Secondary | ICD-10-CM

## 2024-04-13 DIAGNOSIS — D1801 Hemangioma of skin and subcutaneous tissue: Secondary | ICD-10-CM

## 2024-04-13 DIAGNOSIS — Z79899 Other long term (current) drug therapy: Secondary | ICD-10-CM

## 2024-04-13 DIAGNOSIS — D2272 Melanocytic nevi of left lower limb, including hip: Secondary | ICD-10-CM | POA: Diagnosis not present

## 2024-04-13 DIAGNOSIS — L814 Other melanin hyperpigmentation: Secondary | ICD-10-CM

## 2024-04-13 DIAGNOSIS — Z7189 Other specified counseling: Secondary | ICD-10-CM

## 2024-04-13 DIAGNOSIS — L578 Other skin changes due to chronic exposure to nonionizing radiation: Secondary | ICD-10-CM

## 2024-04-13 DIAGNOSIS — D229 Melanocytic nevi, unspecified: Secondary | ICD-10-CM

## 2024-04-13 DIAGNOSIS — L570 Actinic keratosis: Secondary | ICD-10-CM

## 2024-04-13 DIAGNOSIS — B353 Tinea pedis: Secondary | ICD-10-CM

## 2024-04-13 MED ORDER — KETOCONAZOLE 2 % EX CREA: TOPICAL_CREAM | CUTANEOUS | 6 refills | Status: AC

## 2024-04-13 NOTE — Patient Instructions (Addendum)

## 2024-04-13 NOTE — Progress Notes (Unsigned)
   Follow-Up Visit   Subjective  Cory Johnston is a 53 y.o. male who presents for the following: Skin Cancer Screening and Full Body Skin Exam, hx of precancers.   The patient presents for Total-Body Skin Exam (TBSE) for skin cancer screening and mole check. The patient has spots, moles and lesions to be evaluated, some may be new or changing and the patient may have concern these could be cancer.  The following portions of the chart were reviewed this encounter and updated as appropriate: medications, allergies, medical history  Review of Systems:  No other skin or systemic complaints except as noted in HPI or Assessment and Plan.  Objective  Well appearing patient in no apparent distress; mood and affect are within normal limits.  A full examination was performed including scalp, head, eyes, ears, nose, lips, neck, chest, axillae, abdomen, back, buttocks, bilateral upper extremities, bilateral lower extremities, hands, feet, fingers, toes, fingernails, and toenails. All findings within normal limits unless otherwise noted below.   Relevant physical exam findings are noted in the Assessment and Plan.  ears x 7, scalp x 5 (12) Erythematous thin papules/macules with gritty scale.      Assessment & Plan   SKIN CANCER SCREENING PERFORMED TODAY.  LENTIGINES, SEBORRHEIC KERATOSES, HEMANGIOMAS - Benign normal skin lesions - Benign-appearing - Call for any changes  NEVUS  Left plantar foot - Tan-brown and/or pink-flesh-colored symmetric papule- SEE PHOTO - Benign appearing on exam today No changes per patient history - Observation - Call clinic for new or changing moles - Recommend daily use of broad spectrum spf 30+ sunscreen to sun-exposed areas.    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  - Recommend staying in the shade or wearing long sleeves, sun glasses (UVA+UVB protection) and wide brim hats (4-inch brim around the entire circumference of the hat). - Call for new or changing lesions.  Destruction of lesion - ears x 7, scalp x 5 (12) Complexity: simple   Destruction method: cryotherapy   Informed consent: discussed and consent obtained   Timeout:  patient name, date of birth, surgical site, and procedure verified Lesion destroyed using liquid nitrogen: Yes   Region frozen until ice ball extended beyond lesion: Yes   Outcome: patient tolerated procedure well with no complications   Post-procedure details: wound care instructions given     SUSPECT TINEA PEDIS  Exam: Mild Scaling over distal and lateral soles. Chronic and persistent condition with duration or expected duration over one year. Condition is symptomatic / bothersome to patient. Not to goal. Treatment Plan: Start Ketoconazole  2% cream apply to feet at bedtime   Return in about 1 year (around 04/13/2025) for TBSE, hx of AKs .  IFay Kirks, CMA, am acting as scribe for Alm Rhyme, MD .   Documentation: I have reviewed the above documentation for accuracy and completeness, and I agree with the above.  Alm Rhyme, MD

## 2024-04-14 ENCOUNTER — Encounter: Payer: Self-pay | Admitting: Dermatology

## 2024-06-04 ENCOUNTER — Ambulatory Visit: Payer: BC Managed Care – PPO | Admitting: Dermatology

## 2025-04-13 ENCOUNTER — Ambulatory Visit: Admitting: Dermatology
